# Patient Record
Sex: Male | Born: 1960 | Race: White | Marital: Married | Smoking: Never smoker
Health system: Southern US, Community
[De-identification: ages and names within clinical notes are randomized; demographics above are authoritative.]

## PROBLEM LIST (undated history)

## (undated) DIAGNOSIS — C4491 Basal cell carcinoma of skin, unspecified: Secondary | ICD-10-CM

## (undated) DIAGNOSIS — E785 Hyperlipidemia, unspecified: Secondary | ICD-10-CM

## (undated) DIAGNOSIS — I10 Essential (primary) hypertension: Secondary | ICD-10-CM

## (undated) HISTORY — DX: Essential (primary) hypertension: I10

## (undated) HISTORY — PX: SUPRAVENTRICULAR TACHYCARDIA ABLATION: SHX6106

## (undated) HISTORY — PX: TONSILLECTOMY: SUR1361

## (undated) HISTORY — PX: KNEE SURGERY: SHX244

## (undated) HISTORY — PX: MENISCUS REPAIR: SHX5179

## (undated) HISTORY — DX: Basal cell carcinoma of skin, unspecified: C44.91

## (undated) HISTORY — DX: Hyperlipidemia, unspecified: E78.5

## (undated) HISTORY — PX: HERNIA REPAIR: SHX51

---

## 2005-02-18 ENCOUNTER — Ambulatory Visit: Payer: Self-pay | Admitting: Internal Medicine

## 2005-02-22 ENCOUNTER — Ambulatory Visit (HOSPITAL_COMMUNITY): Admission: RE | Admit: 2005-02-22 | Discharge: 2005-02-23 | Payer: Self-pay | Admitting: Internal Medicine

## 2007-02-21 ENCOUNTER — Ambulatory Visit (HOSPITAL_COMMUNITY): Admission: RE | Admit: 2007-02-21 | Discharge: 2007-02-21 | Payer: Self-pay | Admitting: Orthopedic Surgery

## 2007-03-13 ENCOUNTER — Ambulatory Visit: Payer: Self-pay | Admitting: Cardiovascular Disease

## 2007-03-13 LAB — CONVERTED CEMR LAB
Basophils Relative: 0.7 % (ref 0.0–1.0)
CO2: 30 meq/L (ref 19–32)
Chloride: 102 meq/L (ref 96–112)
Creatinine, Ser: 0.9 mg/dL (ref 0.4–1.5)
Eosinophils Relative: 2.4 % (ref 0.0–5.0)
HCT: 41.2 % (ref 39.0–52.0)
Hemoglobin: 14.1 g/dL (ref 13.0–17.0)
INR: 0.8 — ABNORMAL LOW (ref 0.9–2.0)
Monocytes Absolute: 0.6 10*3/uL (ref 0.2–0.7)
Neutrophils Relative %: 51.2 % (ref 43.0–77.0)
Potassium: 4.1 meq/L (ref 3.5–5.1)
Prothrombin Time: 10.9 s (ref 10.0–14.0)
RDW: 11.8 % (ref 11.5–14.6)
Sodium: 139 meq/L (ref 135–145)
WBC: 5.1 10*3/uL (ref 4.5–10.5)

## 2007-03-20 ENCOUNTER — Ambulatory Visit (HOSPITAL_COMMUNITY): Admission: RE | Admit: 2007-03-20 | Discharge: 2007-03-20 | Payer: Self-pay | Admitting: Orthopedic Surgery

## 2007-12-03 ENCOUNTER — Ambulatory Visit: Payer: Self-pay | Admitting: Cardiovascular Disease

## 2007-12-03 LAB — CONVERTED CEMR LAB
ALT: 22 units/L (ref 0–53)
AST: 23 units/L (ref 0–37)
Bilirubin, Direct: 0.1 mg/dL (ref 0.0–0.3)
Total Bilirubin: 1.1 mg/dL (ref 0.3–1.2)

## 2008-08-23 ENCOUNTER — Ambulatory Visit: Payer: Self-pay | Admitting: Cardiovascular Disease

## 2008-08-23 LAB — CONVERTED CEMR LAB
ALT: 26 units/L (ref 0–53)
AST: 25 units/L (ref 0–37)
Albumin: 4.5 g/dL (ref 3.5–5.2)
BUN: 21 mg/dL (ref 6–23)
Basophils Absolute: 0 10*3/uL (ref 0.0–0.1)
Basophils Relative: 0.4 % (ref 0.0–3.0)
CO2: 29 meq/L (ref 19–32)
Calcium: 9.1 mg/dL (ref 8.4–10.5)
Chloride: 103 meq/L (ref 96–112)
Cholesterol: 148 mg/dL (ref 0–200)
Creatinine, Ser: 0.9 mg/dL (ref 0.4–1.5)
Eosinophils Relative: 2 % (ref 0.0–5.0)
Hemoglobin: 14.5 g/dL (ref 13.0–17.0)
LDL Cholesterol: 93 mg/dL (ref 0–99)
Lymphocytes Relative: 33.3 % (ref 12.0–46.0)
MCHC: 33.6 g/dL (ref 30.0–36.0)
MCV: 93.3 fL (ref 78.0–100.0)
Neutro Abs: 2.5 10*3/uL (ref 1.4–7.7)
Neutrophils Relative %: 53.7 % (ref 43.0–77.0)
RBC: 4.61 M/uL (ref 4.22–5.81)
Total Bilirubin: 0.8 mg/dL (ref 0.3–1.2)
Total Protein: 7.2 g/dL (ref 6.0–8.3)
WBC: 4.7 10*3/uL (ref 4.5–10.5)

## 2009-06-28 ENCOUNTER — Encounter (INDEPENDENT_AMBULATORY_CARE_PROVIDER_SITE_OTHER): Payer: Self-pay | Admitting: *Deleted

## 2010-12-25 ENCOUNTER — Other Ambulatory Visit (INDEPENDENT_AMBULATORY_CARE_PROVIDER_SITE_OTHER): Payer: 59

## 2010-12-25 ENCOUNTER — Other Ambulatory Visit: Payer: Self-pay | Admitting: Cardiology

## 2010-12-25 ENCOUNTER — Encounter (INDEPENDENT_AMBULATORY_CARE_PROVIDER_SITE_OTHER): Payer: Self-pay | Admitting: *Deleted

## 2010-12-25 ENCOUNTER — Encounter: Payer: Self-pay | Admitting: Cardiology

## 2010-12-25 DIAGNOSIS — I10 Essential (primary) hypertension: Secondary | ICD-10-CM | POA: Insufficient documentation

## 2010-12-25 LAB — BASIC METABOLIC PANEL
Calcium: 9 mg/dL (ref 8.4–10.5)
GFR: 82.05 mL/min (ref >60.00)
Potassium: 4.2 mEq/L (ref 3.5–5.1)
Sodium: 138 mEq/L (ref 135–145)

## 2010-12-28 ENCOUNTER — Other Ambulatory Visit: Payer: Self-pay | Admitting: Otolaryngology

## 2010-12-28 ENCOUNTER — Ambulatory Visit (HOSPITAL_BASED_OUTPATIENT_CLINIC_OR_DEPARTMENT_OTHER)
Admission: RE | Admit: 2010-12-28 | Discharge: 2010-12-28 | Disposition: A | Discharge status: Home / Self Care | Payer: 59 | Source: Ambulatory Visit | Attending: Otolaryngology | Admitting: Otolaryngology

## 2010-12-28 DIAGNOSIS — I10 Essential (primary) hypertension: Secondary | ICD-10-CM | POA: Insufficient documentation

## 2010-12-28 DIAGNOSIS — Z01812 Encounter for preprocedural laboratory examination: Secondary | ICD-10-CM | POA: Insufficient documentation

## 2010-12-28 DIAGNOSIS — J3501 Chronic tonsillitis: Secondary | ICD-10-CM | POA: Insufficient documentation

## 2010-12-28 LAB — POCT HEMOGLOBIN-HEMACUE: Hemoglobin: 15 g/dL (ref 13.0–17.0)

## 2011-01-03 NOTE — Miscellaneous (Signed)
Summary: Orders Update  Clinical Lists Changes  Problems: Added new problem of ESSENTIAL HYPERTENSION, BENIGN (ICD-401.1) Orders: Added new Test order of TLB-BMP (Basic Metabolic Panel-BMET) (80048-METABOL) - Signed

## 2011-02-25 NOTE — Op Note (Signed)
  NAMEADAMS, William [name]              ACCOUNT NO.:  [id number]  MEDICAL RECORD NO.:  [id number]           PATIENT TYPE:  LOCATION:                                 FACILITY:  PHYSICIAN:  Kinnie Scales. Annalee Genta, M.D.    DATE OF BIRTH:  DATE OF PROCEDURE:  12/28/2010 DATE OF DISCHARGE:                              OPERATIVE REPORT   PREOPERATIVE DIAGNOSIS:  Chronic tonsillitis.  POSTOPERATIVE DIAGNOSIS:  Chronic tonsillitis.  INDICATION FOR SURGERY:  Chronic tonsillitis.  SURGICAL PROCEDURE:  Tonsillectomy  ANESTHESIA:  General endotracheal.  SURGEON:  Carmen Vallecillo L. Annalee Genta, MD  COMPLICATIONS:  None.  BLOOD LOSS:  Minimal.  The patient was transferred from the operating room to the recovery room in stable condition.  BRIEF HISTORY:  The patient is a 50 year old white male with a history of chronic cryptic tonsillitis, chronic tonsil discharge and low-grade sore throat.  Given the patient's history and physical examination, I recommended to undertake tonsillectomy.  The risks, benefits, and possible complications of the procedure were discussed in detail with the patient and his wife and they understood and concurred with our plan for surgery which is scheduled as an outpatient under general anesthesia on December 28, 2010.  PROCEDURE:  The patient was brought to the operating room at [facility] and placed in the supine position on the operating table.  General endotracheal anesthesia was established without difficulty.  When the patient was adequately anesthetized, he was positioned on the operating table and prepped and draped in a sterile fashion.  Crowe-Davis mouthgag was inserted without difficulty. There no loose or broken teeth and the hard and soft palate were intact. The procedure was begun on the left-hand side using Bovie electrocautery and dissecting in subcapsular fashion.  The entire left tonsil was removed from superior pole to tongue base.   Right tonsil was removed in a similar fashion and the tonsil tissue sent to pathology for gross microscopic evaluation.  The tonsillar fossae were gently abraded with a dry tonsil sponge and several small areas of point hemorrhage were then cauterized with suction cautery.  The mouthgag was released and reapplied.  There was no active bleeding.  An orogastric tube was passed.  Stomach contents were aspirated.  The oral cavity and oropharynx were then irrigated and suctioned.  Crowe-Davis mouthgag was released and removed.  No loose or broken teeth.  Hard and soft palate intact.  No bleeding.  The patient was then awakened from his anesthetic, extubated and was transferred from the operating room to the recovery room in stable condition.  There were no complications and blood loss was minimal.    ______________________________ Kinnie Scales. Annalee Genta, M.D.   ______________________________ Kinnie Scales. Annalee Genta, M.D.    DLS/MEDQ  D:  04/54/0981  T:  12/28/2010  Job:  [id number]  Electronically Signed by Osborn Coho M.D. on 02/25/2011 10:17:00 AM

## 2011-03-12 NOTE — Op Note (Signed)
NAMEZOHAR, MARONEY              ACCOUNT NO.:  [id number]   MEDICAL RECORD NO.:  [id number]          PATIENT TYPE:  AMB   LOCATION:  DAY                          FACILITY:  [facility]   PHYSICIAN:  Ollen Gross, M.D.    DATE OF BIRTH:  Dec 16, 1960   DATE OF PROCEDURE:  03/20/2007  DATE OF DISCHARGE:                               OPERATIVE REPORT   PREOPERATIVE DIAGNOSIS:  Left knee lateral meniscal tear.   POSTOPERATIVE DIAGNOSIS:  Left knee lateral meniscal tear.   PROCEDURE:  Left knee arthroscopy with lateral meniscal debridement.   SURGEON:  Ollen Gross, M.D.   ASSISTANT:  None.   ANESTHESIA:  Local with MAC.   ESTIMATED BLOOD LOSS:  Minimal.   DRAINS:  None.   COMPLICATIONS:  None.   CONDITION:  Stable to recovery.   BRIEF CLINICAL NOTE:  Achille is a 50 year old who has had a long history  of problems and activity related with the left knee.  If he is doing any  activity involving jumping or cutting; he will get an effusion and have  lateral pain.  Regular activities are not bothering him.  Recent MRI  shows a lateral meniscal tear.  He presents now for arthroscopy and  debridement.   PROCEDURE IN DETAIL:  After the successful administration of knee block,  his left knee is then prepped and draped in the usual sterile fashion.  A total of 20 mL of 1% Xylocaine is then used to infiltrate the  superomedial, inferomedial, and inferolateral portals.  Superomedial and  inferolateral incisions were made and inflow cannula passed superomedial  and camera passed inferolateral.  Arthroscopic visualization proceeds.  Undersurface of patella and the trochlea looked normal.  He had some  slight synovitis in the suprapatellar area.  Medial and lateral gutters  are visualized; and there were no loose bodies.   Flexion and valgus force applied to the knee; and the medial compartment  was entered.  The medial compartment looks completely normal.  No  evidence of meniscal tear;  and no evidence of chondromalacia or chondral  defect medially.  The spinal needle was used to localize the  inferomedial portal.  A small incision was made, dilator placed, and a  probe placed.  The medial meniscus probes normally.  I flexed and knee  to see the posterior aspect of the condyle; and there was no evidence of  any cartilage damage.   The intercondylar notch was visualized.  There was a significant amount  of hypertrophic synovial tissue anterior to the ACL.  I used the  ArthroCare device to the debride this.  The ACL was extremely attenuated  and thin.  With a probe, it is very lax.  It seems like some of the  fibers are intact, but overall it is not functioning normal, as it is  attenuated.  The lateral compartment is entered.  He has a significant  tear at the posterior horn and body of the lateral meniscus.  The  meniscus was debrided back to a stable base with baskets and a 4.2 mm  shaver.  A posterior part was  thoroughly probed.  I could see the  popliteus tendon and it appears that some of the posterior meniscus is  adhered to the tendon.  This was all probed and stable.  I did not want  to destabilize the popliteus, thus I did not debride it back further.  The ArthroCare device was used to seal off the posterior aspect as well  as the debrided aspect of the body of the lateral meniscus.  It was  probed, again, and found was to be very stable.  I did not see evidence  of any chondral defect on the lateral femoral condyle or lateral tibial  plateau.  There was very mild chondromalacia.   We, again, inspected the rest of the joint; and no other defects or  cartilage tear was noted.  The arthroscopic equipment was then removed  from the inferior portals which were closed with interrupted 4-0 nylon;  20 mL of 1/4% Marcaine with epinephrine were injected through the inflow  cannula; and that was removed and that portal closed with Nylon.  A  bulky sterile dressing is  then applied; and he was awakened and  transported to recovery in stable condition.      Ollen Gross, M.D.  Electronically Signed     FA/MEDQ  D:  03/20/2007  T:  03/20/2007  Job:  [id number]

## 2011-03-15 NOTE — Discharge Summary (Signed)
William [name], William [name]              ACCOUNT NO.:  [id number]   MEDICAL RECORD NO.:  [id number]          PATIENT TYPE:  OIB   LOCATION:  6531                         FACILITY:  [facility]   PHYSICIAN:  Doylene Canning. Ladona Ridgel, M.D.  DATE OF BIRTH:  1961/03/29   DATE OF ADMISSION:  02/22/2005  DATE OF DISCHARGE:                                 DISCHARGE SUMMARY   ADDENDUM:   DISCHARGE MEDICATIONS:  Coated aspirin 1 daily.      GS/MEDQ  D:  02/23/2005  T:  02/23/2005  Job:  [id number]

## 2011-03-15 NOTE — Op Note (Signed)
NAMEJODECI, William [name]              ACCOUNT NO.:  [id number]   MEDICAL RECORD NO.:  [id number]          PATIENT TYPE:  OIB   LOCATION:  NA                           FACILITY:  [facility]   PHYSICIAN:  Doylene Canning. Ladona Ridgel, M.D.  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   INTRODUCTION:  The patient is a 50 year old male with a history of  hypertension. He has had a 5-6 year history of recurrent tachy palpitations  and was found to have multiple episodes of SVT despite medical therapy.  Interestingly enough, during tachycardia, left bundle branch block slows the  tachycardia, narrow QRS tachycardia increases and he transitions from one to  the other on a regular basis. 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 He is referred for  catheter ablation.   PROCEDURE PERFORMED:  After informed consent was obtained, the patient was  taken to the diagnostic EP lab in the fasting state. The usual preparation,  draping, intravenous fentanyl and midazolam was given for sedation. A 6-  French flexible catheter was inserted percutaneously into the right jugular  vein and advanced to the coronary sinus. A 5-French quadripolar catheter was  inserted percutaneously into the right femoral vein and advanced to the RV  apex. A 5-French quadripolar catheter was inserted percutaneously in the  right femoral vein and advanced to the His bundle region. During catheter  manipulation, the patient had incessant SVT. Mapping demonstrated earliest  atrial activation at approximately 5 o'clock on the mitral valve annulus.  This suggested a left posterior or left posterolateral accessory pathway.  The tachycardia  could be terminated with rapid ventricular pacing, but would  frequently reintroduce itself typically with a PAC. Ventricular pacing was  then carried out demonstrating eccentric atrial activation. The rapid  ventricular pacing was incremented down and at 400 milliseconds, resulting  in reinitiation of SVT. Following this, programmed ventricular stimulation  was carried out from the RV apex at a base drive cycle length of 161  milliseconds. The S1 and S2 interval was stepwise decreased down to 310  milliseconds where the retrograde accessory pathway ERP was observed. Next,  rapid atrial pacing was carried out from the coronary sinus as well as  atrium, resulting in incessant induction of SVT. It should be noted that  there was no evidence of any antegrade accessory pathway conduction when  pacing from the coronary sinus right next to the accessory pathway. With all  of the above in hand, the right femoral artery was punctured and a 7-French  quadripolar grip ablation catheter was advanced retrograde across the aortic  valve into the left ventricle. The ablation catheter was maneuvered onto the  mitral annulus and mapping was carried out. Two energy applications were  subsequently delivered during rapid ventricular pacing at 600 milliseconds.  This resulted in the [place] dissociation. Following  this, rapid ventricular  pacing was carried out with no evidence of accessory pathway conduction.  At  this point, rapid atrial pacing was carried out from the coronary sinus, as  well as the right atrium, with paced cycle length of 500 milliseconds and  stepwise decreased down to 320 milliseconds where AV Wenckebach was  observed. During rapid atrial pacing, the PR interval was equal to the R  interval and there was no inducible SVT following catheter ablation. Next,  programmed atrial stimulation was carried out from the coronary sinus as  well as the  right atrium based on a cycle length of 500  milliseconds. The  S1 and S2 interval was stepwise decreased from 440 milliseconds down to 300  milliseconds where the AV node ERP was observed. During programmed atrial  stimulation, there no AH jumps nor any echo beats, and no inducible SVT. At  this point, after an hour of observation and no inducible tachycardia, no  evidence of any left posterolateral accessory pathway conduction, the  catheter was removed. Hemostasis was assured and the patient was returned to  his room in satisfactory condition. Of note, the patient was given heparin  5000 units during the procedure prior to placement of the ablation catheter  into the left ventricle. Following the ablation procedure, he was observed  for 1 hour and had no evidence of accessory pathway conduction. During that  time, his ACT was monitored and was less than 200 following the ablation.   COMPLICATIONS:  There were no immediate procedure complications. It should  be noted that there was transient catheter induced left bundle branch block  persisting for approximately 40 minutes, which resolved spontaneously. This  left bundle branch block occurred prior to any RF ablation therapy  delivered.   RESULTS:  A. Baseline ECG: Baseline ECG demonstrates normal sinus rhythm  with normal axis and intervals.  B. Baseline intervals: Cycle length was 940 milliseconds. The HV interval  was 65 milliseconds. The AH interval 100 milliseconds.  C. Rapid ventricular pacing: Following catheter ablation, rapid ventricular  pacing demonstrated VA dissociation. Prior to catheter ablation, rapid  ventricular pacing demonstrated eccentric atrial activation and incessant  induction of SVT. 4. D. Programmed ventricular stimulation: Programmed  ventricular stimulation was carried out from the RV apex at base drive cycle  lengths of 045 milliseconds. The S1 and S2 interval was stepwise decreased from 440 milliseconds down to 300 milliseconds where the retrograde  pathway  ERP was observed. During programmed ventricular stimulation prior to  ablation, the atrial activation was nondetrimental and eccentric. It should  be noted following catheter ablation, there is VA dissociation.  E. Rapid atrial pacing: Rapid atrial pacing was carried out from the  coronary sinus prior to catheter ablation but at any pacing cycle length,  resulted in induction of SVT. Following catheter ablation, rapid atrial  pacing was carried out from the coronary sinus as well as the right atrium  and stepwise decreased down to 320 milliseconds where AV Wenckebach was  observed. During rapid atrial pacing, the PR interval was equal to but not  greater than the R interval and there was no inducible SVT following  ablation.  F. Programmed atrial stimulation: Programmed atrial stimulation was carried  out from the coronary sinus as well as the right atrium at base drive cycle  length of 409 milliseconds. The S1 and S2 interval was stepwise decreased  from 440 milliseconds down to 300 milliseconds where the AV node ERP was  observed.  During programmed atrial stimulation,  there were no H jumps or  echo beats. The PR interval was equal to the R interval. There was no  inducible SVT following programmed atrial stimulation, following catheter  ablation.  G. Arrhythmias observed:  1.  AV reentrant tachycardia initiation spontaneous as well as with rapid      atrial pacing duration was sustained. Termination was spontaneous as      well as with rapid ventricular pacing, the cycle length was 354      milliseconds.      1.  Mapping: Mapping of the patient's accessory pathway demonstrated          earliest atrial activation approximately between 4 and 5 o'clock on          the mitral annulus.          1.  RF energy application: A total of 2 Hz energy applications were              delivered to the atrial insertion of the accessory pathway              resulting in termination of  accessory pathway conduction,              creation of the VA dissociation, and rendering the tachycardia              noninducible.   CONCLUSION:  The study demonstrates successful electrophysiologic study and  RF catheter ablation of a concealed left posterolateral accessory pathway  with a total of 2 Hz energy applications delivered to the atrial insertion  of the accessory pathway. Prior RF energy application, there was catheter  induced left bundle branch block which resolved spontaneously. Following  catheter ablation, there was no inducible arrhythmias present.      GWT/MEDQ  D:  02/22/2005  T:  02/22/2005  Job:  [id number]

## 2011-03-15 NOTE — Discharge Summary (Signed)
NAMEIDEN, STRIPLING              ACCOUNT NO.:  [id number]   MEDICAL RECORD NO.:  [id number]          PATIENT TYPE:  OIB   LOCATION:  6531                         FACILITY:  [facility]   PHYSICIAN:  Doylene Canning. Ladona Ridgel, M.D.  DATE OF BIRTH:  1961/06/20   DATE OF ADMISSION:  02/22/2005  DATE OF DISCHARGE:  02/23/2005                                 DISCHARGE SUMMARY   PROCEDURE:  Medial frequency catheter ablation February 14, 2005.   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 Sharrell Ku.  Please refer to  admission note for full details.   HOSPITAL COURSE:  The patient presented for elective RF ablation performed  successfully by Dr. Ladona Ridgel on February 22, 2005, with successful ablation of  inducible AVNRT.  There were no noted complications, the patient was kept  overnight observation.   The patient was cleared for discharge the following morning, by Dr. Sharrell Ku, who noted question of minimal hematoma of the right groin.  He  mentioned that there was some initial difficulty with rebleeding.  The  patient was deemed stable for discharge.  Dr. Ladona Ridgel did, however, note  development of transient catheter-induced left bundle branch block, which  resolved one hour later.   Postoperatively, the patient was noted to have development of a minimal  hematoma secondary to initial difficulty with rebleeding.  The patient was  monitored closely by Dr. Ladona Ridgel postprocedure.  On examination the following  morning, Dr. Ladona Ridgel noted presence of a small hematoma, but absent of an  associated bruit.   The patient was cleared for discharge, with recommendation to resume  previous home medication regimen.   DISCHARGE MEDICATIONS:  1.  Toprol XL 50 mg daily.  2.  Hydrochlorothiazide 25 mg daily.  3.  Accupril 40 mg daily.   INSTRUCTIONS:  No heavy lifting for seven  days.   The patient may use either Tylenol or Motrin for pain, as needed.   Arrangements will be made for the patient will follow-up with Dr. Sharrell Ku in ensuing few weeks.   DISCHARGE DIAGNOSES:  1.  Recurrent tachy palpitations/documented supraventricular tachycardia.      1.  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      2.  Transient catheter-induced left bundle branch block.      3.  Small right groin hematoma.  2.  Hypertension.  3.  Remote tobacco.      GS/MEDQ  D:  02/23/2005  T:  02/23/2005  Job:  [id number]

## 2011-07-30 ENCOUNTER — Other Ambulatory Visit: Payer: Self-pay | Admitting: *Deleted

## 2011-07-30 MED ORDER — LISINOPRIL 40 MG PO TABS: 40.0000 mg | ORAL_TABLET | Freq: Every day | ORAL | Status: DC

## 2011-09-20 ENCOUNTER — Other Ambulatory Visit: Payer: Self-pay | Admitting: *Deleted

## 2011-09-20 MED ORDER — FLUTICASONE PROPIONATE 50 MCG/ACT NA SUSP: 2.0000 | Freq: Every day | NASAL | Status: DC

## 2012-10-26 ENCOUNTER — Other Ambulatory Visit: Payer: Self-pay | Admitting: *Deleted

## 2012-10-26 MED ORDER — LISINOPRIL 40 MG PO TABS: 40.0000 mg | ORAL_TABLET | Freq: Every day | ORAL | Status: DC

## 2012-10-28 HISTORY — PX: COLONOSCOPY: SHX174

## 2013-10-01 ENCOUNTER — Ambulatory Visit (AMBULATORY_SURGERY_CENTER): Payer: Self-pay

## 2013-10-01 VITALS — Ht 72.0 in | Wt 195.0 lb

## 2013-10-01 DIAGNOSIS — Z1211 Encounter for screening for malignant neoplasm of colon: Secondary | ICD-10-CM

## 2013-10-01 MED ORDER — NA SULFATE-K SULFATE-MG SULF 17.5-3.13-1.6 GM/177ML PO SOLN: 1.0000 | Freq: Once | ORAL | Status: DC

## 2013-10-15 ENCOUNTER — Encounter: Payer: Self-pay | Admitting: Internal Medicine

## 2013-10-15 ENCOUNTER — Ambulatory Visit (AMBULATORY_SURGERY_CENTER): Payer: 59 | Admitting: Internal Medicine

## 2013-10-15 VITALS — BP 110/76 | HR 62 | Temp 98.2°F | Resp 12 | Ht 72.0 in | Wt 195.0 lb

## 2013-10-15 DIAGNOSIS — D126 Benign neoplasm of colon, unspecified: Secondary | ICD-10-CM

## 2013-10-15 DIAGNOSIS — Z1211 Encounter for screening for malignant neoplasm of colon: Secondary | ICD-10-CM

## 2013-10-15 DIAGNOSIS — K5730 Diverticulosis of large intestine without perforation or abscess without bleeding: Secondary | ICD-10-CM

## 2013-10-15 MED ORDER — SODIUM CHLORIDE 0.9 % IV SOLN: 500.0000 mL | INTRAVENOUS | Status: DC

## 2013-10-15 NOTE — Patient Instructions (Addendum)
I found and removed one polyp it was 1-2 mm. You also have mild sigmoid diverticulosis.  I will let you know the results and recommendations - next routine colonoscopy at least 5 years away.  I appreciate the opportunity to care for you. Iva Boop, MD, [facility]  Discharge instructions given with verbal understanding. Handouts on polyps and diverticulosis. Resume previous medications. YOU HAD AN ENDOSCOPIC PROCEDURE TODAY AT THE [facility]: Refer to the procedure report that was given to you for any specific questions about what was found during the examination.  If the procedure report does not answer your questions, please call your gastroenterologist to clarify.  If you requested that your care partner not be given the details of your procedure findings, then the procedure report has been included in a sealed envelope for you to review at your convenience later.  YOU SHOULD EXPECT: Some feelings of bloating in the abdomen. Passage of more gas than usual.  Walking can help get rid of the air that was put into your GI tract during the procedure and reduce the bloating. If you had a lower endoscopy (such as a colonoscopy or flexible sigmoidoscopy) you may notice spotting of blood in your stool or on the toilet paper. If you underwent a bowel prep for your procedure, then you may not have a normal bowel movement for a few days.  DIET: Your first meal following the procedure should be a light meal and then it is ok to progress to your normal diet.  A half-sandwich or bowl of soup is an example of a good first meal.  Heavy or fried foods are harder to digest and may make you feel nauseous or bloated.  Likewise meals heavy in dairy and vegetables can cause extra gas to form and this can also increase the bloating.  Drink plenty of fluids but you should avoid alcoholic beverages for 24 hours.  ACTIVITY: Your care partner should take you home directly after the procedure.  You should plan to  take it easy, moving slowly for the rest of the day.  You can resume normal activity the day after the procedure however you should NOT DRIVE or use heavy machinery for 24 hours (because of the sedation medicines used during the test).    SYMPTOMS TO REPORT IMMEDIATELY: A gastroenterologist can be reached at any hour.  During normal business hours, 8:30 AM to 5:00 PM Monday through Friday, call [phone].  After hours and on weekends, please call the GI answering service at [phone] who will take a message and have the physician on call contact you.   Following lower endoscopy (colonoscopy or flexible sigmoidoscopy):  Excessive amounts of blood in the stool  Significant tenderness or worsening of abdominal pains  Swelling of the abdomen that is new, acute  Fever of 100F or higher  FOLLOW UP: If any biopsies were taken you will be contacted by phone or by letter within the next 1-3 weeks.  Call your gastroenterologist if you have not heard about the biopsies in 3 weeks.  Our staff will call the home number listed on your records the next business day following your procedure to check on you and address any questions or concerns that you may have at that time regarding the information given to you following your procedure. This is a courtesy call and so if there is no answer at the home number and we have not heard from you through the emergency physician on call,  we will assume that you have returned to your regular daily activities without incident.  SIGNATURES/CONFIDENTIALITY: You and/or your care partner have signed paperwork which will be entered into your electronic medical record.  These signatures attest to the fact that that the information above on your After Visit Summary has been reviewed and is understood.  Full responsibility of the confidentiality of this discharge information lies with you and/or your care-partner.

## 2013-10-15 NOTE — Progress Notes (Signed)
Called to room to assist during endoscopic procedure.  Patient ID and intended procedure confirmed with present staff. Received instructions for my participation in the procedure from the performing physician.  

## 2013-10-15 NOTE — Progress Notes (Signed)
Procedure ends, to recovery, report given and VSS. 

## 2013-10-15 NOTE — Op Note (Signed)
[facility] [address] [place], [address]   COLONOSCOPY PROCEDURE REPORT  PATIENT: William [name], William [name]  MR#: [id number] BIRTHDATE: [birth year] , 52  yrs. old GENDER: Male ENDOSCOPIST: Iva Boop, MD, [facility] PROCEDURE DATE:  10/15/2013 PROCEDURE:   Colonoscopy with biopsy First Screening Colonoscopy - Avg.  risk and is 50 yrs.  old or older Yes.  Prior Negative Screening - Now for repeat screening. N/A  History of Adenoma - Now for follow-up colonoscopy & has been > or = to 3 yrs.  N/A  Polyps Removed Today? Yes. ASA CLASS:   Class II INDICATIONS:average risk screening and first colonoscopy. MEDICATIONS: propofol (Diprivan) 300mg  IV, MAC sedation, administered by CRNA, and These medications were titrated to patient response per physician's verbal order  DESCRIPTION OF PROCEDURE:   After the risks benefits and alternatives of the procedure were thoroughly explained, informed consent was obtained.  A digital rectal exam revealed no abnormalities of the rectum, A digital rectal exam revealed the prostate was not enlarged, and A digital rectal exam revealed no prostatic nodules.   The LB [id number]  endoscope was introduced through the anus and advanced to the cecum, which was identified by both the appendix and ileocecal valve. No adverse events experienced.   The quality of the prep was excellent using Suprep  The instrument was then slowly withdrawn as the colon was fully examined.   COLON FINDINGS: A sessile polyp measuring 1-2 mm in size was found at the splenic flexure.  A polypectomy was performed with cold forceps.  The resection was complete and the polyp tissue was completely retrieved.   There was mild scattered diverticulosis noted in the sigmoid colon.   The colon mucosa was otherwise normal.   A right colon retroflexion was performed.  Retroflexed views revealed no abnormalities. The time to cecum=5 minutes 45 seconds.  Withdrawal time=9  minutes 48 seconds.  The scope was withdrawn and the procedure completed. COMPLICATIONS: There were no complications.  ENDOSCOPIC IMPRESSION: 1.   Sessile polyp measuring 1-2 mm in size was found at the splenic flexure; polypectomy was performed with cold forceps 2.   There was mild diverticulosis noted in the sigmoid colon 3.   The colon mucosa was otherwise normal - excellent prep - first colonoscopy 4.   Normal prostate  RECOMMENDATIONS: Timing of repeat colonoscopy will be determined by pathology findings.   eSigned:  Iva Boop, MD, [facility] 10/15/2013 8:38 AM  cc: The Patient

## 2013-10-15 NOTE — Progress Notes (Signed)
Patient did not experience any of the following events: a burn prior to discharge; a fall within the facility; wrong site/side/patient/procedure/implant event; or a hospital transfer or hospital admission upon discharge from the facility. (G8907) Patient did not have preoperative order for IV antibiotic SSI prophylaxis. (G8918)  

## 2013-10-18 ENCOUNTER — Telehealth: Payer: Self-pay

## 2013-10-18 NOTE — Telephone Encounter (Signed)
  Follow up Call-  Call back number 10/15/2013  Post procedure Call Back phone  # [phone]  Permission to leave phone message Yes     Patient questions:  Do you have a fever, pain , or abdominal swelling? no Pain Score  0 *  Have you tolerated food without any problems? yes  Have you been able to return to your normal activities? yes  Do you have any questions about your discharge instructions: Diet   no Medications  no Follow up visit  no  Do you have questions or concerns about your Care? no  Actions: * If pain score is 4 or above: No action needed, pain <4.   No problems per the pt. Maw

## 2013-10-24 ENCOUNTER — Encounter: Payer: Self-pay | Admitting: Internal Medicine

## 2013-10-24 NOTE — Progress Notes (Signed)
Quick Note:  Benign lymphoid polyp - repeat colon 2024 ______

## 2013-11-16 ENCOUNTER — Other Ambulatory Visit: Payer: Self-pay | Admitting: *Deleted

## 2013-11-16 MED ORDER — LISINOPRIL 40 MG PO TABS: 40.0000 mg | ORAL_TABLET | Freq: Every day | ORAL | Status: DC

## 2014-11-24 ENCOUNTER — Other Ambulatory Visit: Payer: Self-pay | Admitting: *Deleted

## 2014-11-24 MED ORDER — LISINOPRIL 40 MG PO TABS: 40.0000 mg | ORAL_TABLET | Freq: Every day | ORAL | Status: DC

## 2015-04-07 ENCOUNTER — Other Ambulatory Visit: Payer: Self-pay | Admitting: *Deleted

## 2015-04-07 MED ORDER — LOTEPREDNOL ETABONATE 0.5 % OP SUSP: 1.0000 [drp] | Freq: Four times a day (QID) | OPHTHALMIC | Status: DC

## 2015-08-07 ENCOUNTER — Other Ambulatory Visit: Payer: Self-pay | Admitting: *Deleted

## 2015-08-07 MED ORDER — ROSUVASTATIN CALCIUM 5 MG PO TABS
5.0000 mg | ORAL_TABLET | Freq: Every day | ORAL | Status: DC
Start: 2015-08-07 — End: 2018-11-30

## 2015-11-24 ENCOUNTER — Other Ambulatory Visit: Payer: Self-pay | Admitting: *Deleted

## 2015-11-24 MED ORDER — LISINOPRIL 40 MG PO TABS: 40.0000 mg | ORAL_TABLET | Freq: Every day | ORAL | Status: DC

## 2015-11-24 MED FILL — ROSUVASTATIN CALCIUM 5 MG T: 5 | 90 days supply | Qty: 90 | Fill #1

## 2015-11-24 MED FILL — LISINOPRIL 40 MG TABLET: 40 | 90 days supply | Qty: 90 | Fill #0

## 2016-02-21 MED FILL — ROSUVASTATIN CALCIUM 5 MG T: 5 | 90 days supply | Qty: 90 | Fill #2

## 2016-02-21 MED FILL — LISINOPRIL 40 MG TABLET: 40 | 90 days supply | Qty: 90 | Fill #1

## 2016-05-27 MED FILL — LISINOPRIL 40 MG TABLET: 40 | 90 days supply | Qty: 90 | Fill #2

## 2016-05-27 MED FILL — ROSUVASTATIN CALCIUM 5 MG T: 5 | 90 days supply | Qty: 90 | Fill #3

## 2016-08-01 MED FILL — CIALIS 20 MG TABLET: 20 | 30 days supply | Qty: 6 | Fill #0

## 2016-08-22 MED FILL — LISINOPRIL 40 MG TABLET: 40 | 90 days supply | Qty: 90 | Fill #3

## 2016-08-23 MED FILL — ROSUVASTATIN CALCIUM 10 MG: 10 | 90 days supply | Qty: 90 | Fill #0

## 2016-09-30 MED FILL — CIALIS 20 MG TABLET: 20 | 30 days supply | Qty: 6 | Fill #1

## 2016-11-04 DIAGNOSIS — D485 Neoplasm of uncertain behavior of skin: Secondary | ICD-10-CM | POA: Diagnosis not present

## 2016-11-04 DIAGNOSIS — D2272 Melanocytic nevi of left lower limb, including hip: Secondary | ICD-10-CM | POA: Diagnosis not present

## 2016-11-04 DIAGNOSIS — D225 Melanocytic nevi of trunk: Secondary | ICD-10-CM | POA: Diagnosis not present

## 2016-11-04 DIAGNOSIS — D2271 Melanocytic nevi of right lower limb, including hip: Secondary | ICD-10-CM | POA: Diagnosis not present

## 2016-11-04 DIAGNOSIS — D1801 Hemangioma of skin and subcutaneous tissue: Secondary | ICD-10-CM | POA: Diagnosis not present

## 2016-11-04 DIAGNOSIS — Z85828 Personal history of other malignant neoplasm of skin: Secondary | ICD-10-CM | POA: Diagnosis not present

## 2016-11-04 DIAGNOSIS — L821 Other seborrheic keratosis: Secondary | ICD-10-CM | POA: Diagnosis not present

## 2016-11-18 MED FILL — ROSUVASTATIN CALCIUM 10 MG: 10 | 90 days supply | Qty: 90 | Fill #1

## 2016-11-19 MED FILL — LISINOPRIL 40 MG TABLET: 40 | 90 days supply | Qty: 90 | Fill #0

## 2016-11-22 DIAGNOSIS — H524 Presbyopia: Secondary | ICD-10-CM | POA: Diagnosis not present

## 2017-02-18 MED FILL — LISINOPRIL 40 MG TABLET: 40 | 90 days supply | Qty: 90 | Fill #1

## 2017-02-18 MED FILL — ROSUVASTATIN CALCIUM 10 MG: 10 | 90 days supply | Qty: 90 | Fill #2

## 2017-05-26 MED FILL — LISINOPRIL 40 MG TAB: 40 | 90 days supply | Qty: 90 | Fill #2

## 2017-05-26 MED FILL — ROSUVASTATIN CALCIUM 10 MG: 10 | 90 days supply | Qty: 90 | Fill #3

## 2017-06-10 DIAGNOSIS — N528 Other male erectile dysfunction: Secondary | ICD-10-CM | POA: Diagnosis not present

## 2017-06-10 DIAGNOSIS — Z125 Encounter for screening for malignant neoplasm of prostate: Secondary | ICD-10-CM | POA: Diagnosis not present

## 2017-06-10 DIAGNOSIS — I10 Essential (primary) hypertension: Secondary | ICD-10-CM | POA: Diagnosis not present

## 2017-06-10 DIAGNOSIS — Z0000 Encounter for general adult medical examination without abnormal findings: Secondary | ICD-10-CM | POA: Diagnosis not present

## 2017-06-10 DIAGNOSIS — Z1389 Encounter for screening for other disorder: Secondary | ICD-10-CM | POA: Diagnosis not present

## 2017-06-10 DIAGNOSIS — E784 Other hyperlipidemia: Secondary | ICD-10-CM | POA: Diagnosis not present

## 2017-06-10 DIAGNOSIS — Z6826 Body mass index (BMI) 26.0-26.9, adult: Secondary | ICD-10-CM | POA: Diagnosis not present

## 2017-06-10 DIAGNOSIS — Z8249 Family history of ischemic heart disease and other diseases of the circulatory system: Secondary | ICD-10-CM | POA: Diagnosis not present

## 2017-08-15 MED FILL — LISINOPRIL 40 MG TABLET: 40 | 90 days supply | Qty: 90 | Fill #3

## 2017-08-15 MED FILL — ROSUVASTATIN CALCIUM 10 MG: 10 | 90 days supply | Qty: 90 | Fill #4

## 2017-11-20 MED FILL — ROSUVASTATIN CALCIUM 10 MG: 10 | 90 days supply | Qty: 90 | Fill #0

## 2017-11-20 MED FILL — LISINOPRIL 40 MG TABLET: 40 | 90 days supply | Qty: 90 | Fill #0

## 2018-02-18 MED FILL — LISINOPRIL 40 MG TABLET: 40 | 90 days supply | Qty: 90 | Fill #1

## 2018-02-18 MED FILL — ROSUVASTATIN CALCIUM 10 MG: 10 | 90 days supply | Qty: 90 | Fill #1

## 2018-05-13 DIAGNOSIS — H524 Presbyopia: Secondary | ICD-10-CM | POA: Diagnosis not present

## 2018-05-18 MED FILL — ROSUVASTATIN CALCIUM 10 MG: 10 | 90 days supply | Qty: 90 | Fill #2

## 2018-05-18 MED FILL — LISINOPRIL 40 MG TABLET: 40 | 90 days supply | Qty: 90 | Fill #2

## 2018-08-17 MED FILL — LISINOPRIL 40 MG TABLET: 40 | 90 days supply | Qty: 90 | Fill #0

## 2018-08-17 MED FILL — ROSUVASTATIN CALCIUM 10 MG: 10 | 90 days supply | Qty: 90 | Fill #0

## 2018-11-11 MED FILL — LISINOPRIL 40 MG TABLET: 40 | 90 days supply | Qty: 90 | Fill #1

## 2018-11-11 MED FILL — ROSUVASTATIN CALCIUM 10 MG: 10 | 90 days supply | Qty: 90 | Fill #1

## 2018-11-30 ENCOUNTER — Other Ambulatory Visit: Payer: Self-pay | Admitting: *Deleted

## 2018-11-30 MED ORDER — ROSUVASTATIN CALCIUM 40 MG PO TABS: 40.0000 mg | ORAL_TABLET | Freq: Every day | ORAL | 3 refills | Status: DC

## 2018-11-30 MED FILL — ROSUVASTATIN CALCIUM 40 MG: 40 | 90 days supply | Qty: 90 | Fill #0 | Status: TO

## 2019-01-27 MED FILL — LISINOPRIL 40 MG TABLET: 40 | 90 days supply | Qty: 90 | Fill #0

## 2019-02-10 MED FILL — ROSUVASTATIN CALCIUM 40 MG: 40 | 90 days supply | Qty: 90 | Fill #0

## 2019-03-05 DIAGNOSIS — Z8249 Family history of ischemic heart disease and other diseases of the circulatory system: Secondary | ICD-10-CM | POA: Diagnosis not present

## 2019-03-05 DIAGNOSIS — E785 Hyperlipidemia, unspecified: Secondary | ICD-10-CM | POA: Diagnosis not present

## 2019-03-05 DIAGNOSIS — I10 Essential (primary) hypertension: Secondary | ICD-10-CM | POA: Diagnosis not present

## 2019-03-05 DIAGNOSIS — N529 Male erectile dysfunction, unspecified: Secondary | ICD-10-CM | POA: Diagnosis not present

## 2019-03-05 DIAGNOSIS — Z0000 Encounter for general adult medical examination without abnormal findings: Secondary | ICD-10-CM | POA: Diagnosis not present

## 2019-03-08 DIAGNOSIS — E7849 Other hyperlipidemia: Secondary | ICD-10-CM | POA: Diagnosis not present

## 2019-03-08 DIAGNOSIS — Z125 Encounter for screening for malignant neoplasm of prostate: Secondary | ICD-10-CM | POA: Diagnosis not present

## 2019-03-08 DIAGNOSIS — Z0000 Encounter for general adult medical examination without abnormal findings: Secondary | ICD-10-CM | POA: Diagnosis not present

## 2019-03-19 DIAGNOSIS — R82998 Other abnormal findings in urine: Secondary | ICD-10-CM | POA: Diagnosis not present

## 2019-05-05 MED FILL — LISINOPRIL 40 MG TABLET: 40 | 90 days supply | Qty: 90 | Fill #0

## 2019-06-17 MED FILL — ROSUVASTATIN CALCIUM 40 MG: 40 | 90 days supply | Qty: 90 | Fill #0

## 2019-08-09 MED FILL — LISINOPRIL 40 MG TABLET: 40 | 90 days supply | Qty: 90 | Fill #1

## 2019-09-14 MED FILL — ROSUVASTATIN CALCIUM 40 MG: 40 | 90 days supply | Qty: 90 | Fill #1

## 2019-11-01 MED FILL — LISINOPRIL 40 MG TABLET: 40 | 90 days supply | Qty: 90 | Fill #2

## 2019-12-14 MED FILL — ROSUVASTATIN CALCIUM 40 MG: 40 | 90 days supply | Qty: 90 | Fill #0

## 2020-01-26 MED FILL — LISINOPRIL 40 MG TABLET: 40 | 90 days supply | Qty: 90 | Fill #0

## 2020-01-27 ENCOUNTER — Other Ambulatory Visit (HOSPITAL_COMMUNITY): Payer: Self-pay | Admitting: Internal Medicine

## 2020-05-10 MED FILL — LISINOPRIL 40 MG TABS: 40 | 30 days supply | Qty: 30 | Fill #0

## 2020-06-07 MED FILL — LISINOPRIL 40 MG TABS: 40 | 90 days supply | Qty: 90 | Fill #1

## 2020-06-12 MED FILL — ROSUVASTATIN CALCIUM 40 MG: 40 | 90 days supply | Qty: 90 | Fill #2

## 2020-07-12 ENCOUNTER — Encounter: Payer: Self-pay | Admitting: Orthopedic Surgery

## 2020-07-12 ENCOUNTER — Ambulatory Visit (INDEPENDENT_AMBULATORY_CARE_PROVIDER_SITE_OTHER): Payer: 59

## 2020-07-12 ENCOUNTER — Ambulatory Visit (INDEPENDENT_AMBULATORY_CARE_PROVIDER_SITE_OTHER): Payer: 59 | Admitting: Orthopedic Surgery

## 2020-07-12 DIAGNOSIS — M25512 Pain in left shoulder: Secondary | ICD-10-CM | POA: Diagnosis not present

## 2020-07-12 DIAGNOSIS — M7552 Bursitis of left shoulder: Secondary | ICD-10-CM | POA: Diagnosis not present

## 2020-07-12 NOTE — Progress Notes (Signed)
Office Visit Note   Patient: William [name]           Date of Birth: 01/21/1961           MRN: [id number] Visit Date: 07/12/2020 Requested by: Marton Redwood, MD [address] [place],  [place] [address] PCP: Marton Redwood, MD  Subjective: Chief Complaint  Patient presents with  . 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  Denies any discrete injury but he was painting a cabinet and was leaning on that arm for extended periods of time during that painting.  Denies any neck pain.  No radicular symptoms and denies any loss of range of motion.  Localizes this pain primarily to the anterolateral aspect of the shoulder in the bicipital groove region.  Takes meloxicam occasionally for his symptoms.  He describes that the primary motion which hurts the shoulder is abduction.  Forward flexion is well-tolerated.  Denies any mechanical symptoms.  He is right-hand dominant.  Difficult for Dewayne to sleep on that side.  Will occasionally wake him from sleep at night.  He is unable to do push-ups or physical fitness types of activities which involve his shoulder.              ROS: All systems reviewed are negative as they relate to the chief complaint within the history of present illness.  Patient denies  fevers or chills.   Assessment & Plan: Visit Diagnoses:  1. Left shoulder pain, unspecified chronicity   2. Presence of left artificial shoulder joint     Plan: Impression is left shoulder pain with no discrete AC joint tenderness to direct palpation left versus right.  Rotator cuff strength is pretty reasonable except for objective mild weakness to infraspinatus testing on the left compared to the right which may be pain related.  Labral testing unremarkable.  No coarse grinding or crepitus in the shoulder.  No restriction of passive range of motion.  I think Darroll may have biceps tendinitis versus SLAP tear versus rotator cuff  strain of the infraspinatus muscle tendon junction.  Rotator cuff tearing less likely based on history.  Due to persistence of symptoms longer than 2-1/2 months as well as failure of conservative management as well as a component of pain which wakes him from sleep at night as well as the objective finding of weakness on infraspinatus testing I recommend MRI arthrogram to evaluate for biceps tendinitis versus SLAP tear versus infraspinatus tear versus strain versus bursitis.  Follow-up after that study  Follow-Up Instructions: No follow-ups on file.   Orders:  Orders Placed This Encounter  Procedures  . XR Shoulder Left  . MR Shoulder Left w/ contrast  . Arthrogram   No orders of the defined types were placed in this encounter.     Procedures: No procedures performed   Clinical Data: No additional findings.  Objective: Vital Signs: There were no vitals taken for this visit.  Physical Exam:   Constitutional: Patient appears well-developed HEENT:  Head: Normocephalic Eyes:EOM are normal Neck: Normal range of motion Cardiovascular: Normal rate Pulmonary/chest: Effort normal Neurologic: Patient is alert Skin: Skin is warm Psychiatric: Patient has normal mood and affect    Ortho Exam: Ortho exam demonstrates good cervical spine range of motion.  5 out of 5 grip EPL FPL interosseous wrist flexion extension bicep triceps and deltoid strength.  No restriction of external rotation of 15 degrees of abduction.  Both go to about 45 degrees.  Does have pain with resisted infraspinatus testing on the left compared to the right and about 5- out of 5 strength left versus right on infraspinatus testing.  Supraspinatus subscap testing 5+ out of 5 bilaterally.  O'Brien's testing equivocal on the left negative on the right.  No discrete AC joint tenderness to direct palpation or with crossarm adduction on the left-hand side  Specialty Comments:  No specialty comments available.  Imaging: XR  Shoulder Left  Result Date: 07/12/2020 AP outlet axillary left shoulder reviewed.  No acute fracture.  Acromiohumeral distance maintained.  No significant glenohumeral joint arthritis or AC joint arthritis.  Visualized lung fields clear.  Normal left shoulder radiographs.    PMFS History: Patient Active Problem List   Diagnosis Date Noted  . ESSENTIAL HYPERTENSION, BENIGN 12/25/2010   Past Medical History:  Diagnosis Date  . Basal cell cancer    side nose  . Hypertension     No family history on file.  Past Surgical History:  Procedure Laterality Date  . KNEE SURGERY     ACL repair on right  . MENISCUS REPAIR     left knee  . SUPRAVENTRICULAR TACHYCARDIA ABLATION    . TONSILLECTOMY     Social History   Occupational History  . Not on file  Tobacco Use  . Smoking status: Never Smoker  . Smokeless tobacco: Never Used  Substance and Sexual Activity  . Alcohol use: Yes    Alcohol/week: 7.0 - 9.0 standard drinks    Types: 7 - 9 drink(s) per week  . Drug use: No  . Sexual activity: Not on file

## 2020-07-27 DIAGNOSIS — H524 Presbyopia: Secondary | ICD-10-CM | POA: Diagnosis not present

## 2020-08-07 ENCOUNTER — Ambulatory Visit
Admission: RE | Admit: 2020-08-07 | Discharge: 2020-08-07 | Disposition: A | Discharge status: Home / Self Care | Payer: 59 | Source: Ambulatory Visit | Attending: Orthopedic Surgery | Admitting: Orthopedic Surgery

## 2020-08-07 DIAGNOSIS — M7552 Bursitis of left shoulder: Secondary | ICD-10-CM

## 2020-08-07 DIAGNOSIS — M19012 Primary osteoarthritis, left shoulder: Secondary | ICD-10-CM | POA: Diagnosis not present

## 2020-08-07 DIAGNOSIS — M25512 Pain in left shoulder: Secondary | ICD-10-CM

## 2020-08-07 MED ORDER — IOPAMIDOL (ISOVUE-M 200) INJECTION 41%: 12.0000 mL | Freq: Once | INTRAMUSCULAR | Status: DC

## 2020-08-09 NOTE — Progress Notes (Signed)
Hi [name].  I called William [name] about his results.  Plan at this time is intra-articular injection into the glenohumeral joint for resetting of his pain.  Can you please have Dr. 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  Thanks

## 2020-08-09 NOTE — Progress Notes (Signed)
Also can you cancel appointment with me on 08/18/2020 thanks

## 2020-08-12 NOTE — Progress Notes (Signed)
Thanks Fred for getting him in

## 2020-08-18 ENCOUNTER — Ambulatory Visit: Payer: 59 | Admitting: Orthopedic Surgery

## 2020-08-24 ENCOUNTER — Other Ambulatory Visit: Payer: Self-pay

## 2020-08-24 ENCOUNTER — Ambulatory Visit (INDEPENDENT_AMBULATORY_CARE_PROVIDER_SITE_OTHER): Payer: 59 | Admitting: Physical Medicine and Rehabilitation

## 2020-08-24 ENCOUNTER — Ambulatory Visit: Payer: Self-pay

## 2020-08-24 ENCOUNTER — Encounter: Payer: Self-pay | Admitting: Physical Medicine and Rehabilitation

## 2020-08-24 VITALS — BP 130/88 | HR 72

## 2020-08-24 DIAGNOSIS — G8929 Other chronic pain: Secondary | ICD-10-CM | POA: Diagnosis not present

## 2020-08-24 DIAGNOSIS — M25512 Pain in left shoulder: Secondary | ICD-10-CM | POA: Diagnosis not present

## 2020-08-24 NOTE — Progress Notes (Signed)
Pt state left shoulder pain. Pt state move his arm up and down makes the pain worse. Pt state laying on his shoulders hurts.   Numeric Pain Rating Scale and Functional Assessment Average Pain 2   In the last MONTH (on 0-10 scale) has pain interfered with the following?  1. General activity like being  able to carry out your everyday physical activities such as walking, climbing stairs, carrying groceries, or moving a chair?  Rating(6)   +Driver, -BT, -Dye Allergies.

## 2020-08-24 NOTE — Progress Notes (Signed)
William [name] - 59 y.o. male MRN [id number]  Date of birth: Jan 04, 1961  Office Visit Note: Visit Date: 08/24/2020 PCP: Marton Redwood, MD Referred by: Marton Redwood, MD  Subjective: Chief Complaint  Patient presents with  . Left Shoulder - Pain   HPI:  William [name] is a 59 y.o. male who comes in today at the request of Dr. Anderson Malta for planned Left glenohumeral joint injection with fluoroscopic guidance.  He reports this pain now for several months.  He has had MRI arthrogram and that is reviewed below.  He has difficulty doing push-up type maneuvers and abduction.  He does not really have any mechanical complaints.  Has a lot of pain with sleeping on his shoulders.  No real radicular complaints or paresthesias.  The patient has failed conservative care including home exercise, medications, time and activity modification.  This injection will be diagnostic and hopefully therapeutic.  Please see requesting physician notes for further details and justification.   ROS Otherwise per HPI.  Assessment & Plan: Visit Diagnoses:  1. Chronic left shoulder pain     Plan: No additional findings.   Meds & Orders: No orders of the defined types were placed in this encounter.   Orders Placed This Encounter  Procedures  . Large Joint Inj: L glenohumeral  . XR C-ARM NO REPORT    Follow-up: Return for visit to requesting physician as needed.   Procedures: Large Joint Inj: L glenohumeral on 08/24/2020 1:57 PM Indications: pain and diagnostic evaluation Details: 22 G 3.5 in needle, fluoroscopy-guided anteromedial approach  Arthrogram: No  Medications: 3 mL bupivacaine 0.5 %; 60 mg triamcinolone acetonide 40 MG/ML Outcome: tolerated well, no immediate complications  There was excellent flow of contrast producing a partial arthrogram of the glenohumeral joint. The patient did have mild but not dramatic relief of symptoms during the anesthetic phase of the injection. Procedure,  treatment alternatives, risks and benefits explained, specific risks discussed. Consent was given by the patient. Immediately prior to procedure a time out was called to verify the correct patient, procedure, equipment, support staff and site/side marked as required. Patient was prepped and draped in the usual sterile fashion.      No notes on file   Clinical History: MR ARTHROGRAM OF THE LEFT SHOULDER  TECHNIQUE: Multiplanar, multisequence MR imaging of the left shoulder was performed following the administration of intra-articular contrast.  CONTRAST:  See Injection Documentation.  COMPARISON:  Image from contrast injection reviewed. Plain films left shoulder 07/12/2020.  FINDINGS: Rotator cuff: There is heterogeneously increased T2 signal in the cuff tendons consistent with tendinopathy. No tear.  Muscles: Normal without atrophy or focal lesion.  Biceps long head: Intact and normal in appearance.  Acromioclavicular Joint: Moderate osteoarthritis. The acromion is type 2. No contrast extravasates into the subacromial/subdeltoid bursa. There is a small volume of fluid in the bursa.  Glenohumeral Joint: Negative.  Labrum: 4-5 small paralabral cysts are seen along the posterior glenoid indicative of a labral tear. Contrast undermines the posteroinferior and inferior labrum as seen on image 14 and 15 of series 3 and images 7-9 of series 6 consistent with a tear. There is also a small tear of the anterior, superior labrum at the attachment of the long head of biceps.  Bones: No fracture, contusion or worrisome lesion.  IMPRESSION: Posterior to inferior labral tear as described above. There is also a tear of the superior labrum at the biceps tendon attachment.  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  Moderate acromioclavicular osteoarthritis.  Small volume of subacromial/subdeltoid fluid consistent with bursitis.   Electronically Signed   By: Inge Rise M.D.   On: 08/08/2020 11:01     Objective:  VS:  HT:    WT:   BMI:     BP:130/88  HR:72bpm  TEMP: ( )  RESP:  Physical Exam   Imaging: No results found.

## 2020-09-05 MED FILL — ROSUVASTATIN CALCIUM 40 MG: 40 | 90 days supply | Qty: 90 | Fill #3

## 2020-09-05 MED FILL — LISINOPRIL 40 MG TABS: 40 | 90 days supply | Qty: 90 | Fill #2

## 2020-09-19 MED ORDER — TRIAMCINOLONE ACETONIDE 40 MG/ML IJ SUSP
60.0000 mg | INTRAMUSCULAR | Status: AC | PRN
  Administered 2020-08-24: 60 mg via INTRA_ARTICULAR

## 2020-09-19 MED ORDER — BUPIVACAINE HCL 0.5 % IJ SOLN
3.0000 mL | INTRAMUSCULAR | Status: AC | PRN
  Administered 2020-08-24: 3 mL via INTRA_ARTICULAR

## 2020-11-16 DIAGNOSIS — M25512 Pain in left shoulder: Secondary | ICD-10-CM | POA: Diagnosis not present

## 2020-11-16 DIAGNOSIS — N529 Male erectile dysfunction, unspecified: Secondary | ICD-10-CM | POA: Diagnosis not present

## 2020-11-16 DIAGNOSIS — I10 Essential (primary) hypertension: Secondary | ICD-10-CM | POA: Diagnosis not present

## 2020-11-16 DIAGNOSIS — Z8249 Family history of ischemic heart disease and other diseases of the circulatory system: Secondary | ICD-10-CM | POA: Diagnosis not present

## 2020-11-16 DIAGNOSIS — E785 Hyperlipidemia, unspecified: Secondary | ICD-10-CM | POA: Diagnosis not present

## 2020-11-16 DIAGNOSIS — Z0000 Encounter for general adult medical examination without abnormal findings: Secondary | ICD-10-CM | POA: Diagnosis not present

## 2020-11-28 ENCOUNTER — Other Ambulatory Visit (HOSPITAL_COMMUNITY): Payer: Self-pay | Admitting: Internal Medicine

## 2020-11-28 MED FILL — LISINOPRIL 40 MG TABS: 40 | 90 days supply | Qty: 90 | Fill #0

## 2020-11-28 MED FILL — ROSUVASTATIN CALCIUM 40 MG: 40 | 90 days supply | Qty: 90 | Fill #0

## 2020-12-27 ENCOUNTER — Other Ambulatory Visit: Payer: Self-pay

## 2020-12-27 ENCOUNTER — Encounter: Payer: Self-pay | Admitting: Physical Therapy

## 2020-12-27 ENCOUNTER — Ambulatory Visit: Payer: 59 | Attending: Internal Medicine | Admitting: Physical Therapy

## 2020-12-27 DIAGNOSIS — G8929 Other chronic pain: Secondary | ICD-10-CM | POA: Diagnosis not present

## 2020-12-27 DIAGNOSIS — M25512 Pain in left shoulder: Secondary | ICD-10-CM | POA: Diagnosis not present

## 2020-12-27 NOTE — Therapy (Signed)
[facility] [address], [place], [place], [address] Phone: [phone]   Fax:  [phone]  Physical Therapy Evaluation  Patient Details  Name: William [name] MRN: [id number] Date of Birth: [birth year] Referring Provider (PT): Marton Redwood MD   Encounter Date: 12/27/2020   PT End of Session - 12/27/20 1358    Visit Number 1    Date for PT Re-Evaluation 02/21/21    Authorization Type UMR    PT Start Time 1400    PT Stop Time 1452    PT Time Calculation (min) 52 min    Activity Tolerance Patient tolerated treatment well;Patient limited by pain    Behavior During Therapy [facility] for tasks assessed/performed           Past Medical History:  Diagnosis Date  . Basal cell cancer    side nose  . Hypertension     Past Surgical History:  Procedure Laterality Date  . KNEE SURGERY     ACL repair on right  . MENISCUS REPAIR     left knee  . SUPRAVENTRICULAR TACHYCARDIA ABLATION    . TONSILLECTOMY      There were no vitals filed for this visit.    Subjective Assessment - 12/27/20 1403    Subjective Patient has left shoulder pain mainly with ABD for 8 months. Had injection which helped for 4-6 wks but now it's back to hurting. Wakes him up at night    Pertinent History HTN, Rt ACL repair, Lt meniscus repair    Diagnostic tests labral tear; no RC tear    Patient Stated Goals pain to go away and do what I want to do    Currently in Pain? Yes    Pain Score 4     Pain Location Shoulder    Pain Orientation Left    Pain Descriptors / Indicators Sharp    Pain Type Chronic pain    Pain Onset More than a month ago    Pain Frequency Intermittent    Aggravating Factors  lying on it, ABD, push ups    Effect of Pain on Daily Activities unable to work out with pushups, sleep              [facility] PT Assessment - 12/27/20 0001      Assessment   Medical Diagnosis left shoulder pain    Referring Provider (PT) Marton Redwood MD    Onset Date/Surgical Date 04/27/20    Hand Dominance Right    Prior Therapy no      Precautions   Precautions None      Restrictions   Weight Bearing Restrictions No      Balance Screen   Has the patient fallen in the past 6 months No    Has the patient had a decrease in activity level because of a fear of falling?  No    Is the patient reluctant to leave their home because of a fear of falling?  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      Observation/Other Assessments   Focus on Therapeutic Outcomes ([place])  FS = 63; goal 73      Posture/Postural Control   Posture/Postural Control Postural limitations    Postural  Limitations Forward head;Rounded Shoulders;Increased thoracic kyphosis      ROM / Strength   AROM / PROM / Strength AROM;PROM;Strength      AROM   Overall AROM Comments pain with ABD    AROM Assessment Site Shoulder    Right/Left Shoulder Left    Left Shoulder Extension --   full   Left Shoulder Flexion 160 Degrees   standing 150   Left Shoulder ABduction 145 Degrees   130   Left Shoulder Internal Rotation 65 Degrees    Left Shoulder External Rotation --   full     PROM   Overall PROM Comments Pain with end range flexion, IR and ABD    PROM Assessment Site Shoulder    Right/Left Shoulder Left    Left Shoulder Flexion 170 Degrees    Left Shoulder ABduction 163 Degrees    Left Shoulder Internal Rotation 75 Degrees   some pain     Strength   Strength Assessment Site Shoulder    Right/Left Shoulder Left    Left Shoulder Flexion 4+/5    Left Shoulder Extension 5/5    Left Shoulder ABduction 4/5    Left Shoulder Internal Rotation 5/5    Left Shoulder External Rotation 4/5    Left Shoulder Horizontal ABduction 3+/5      Flexibility   Soft Tissue Assessment /Muscle  Length --   tight pectorals and lats     Palpation   Spinal mobility decreased PA mobility throughout thoracic spine    Palpation comment pinpoint tenderness as supraspinatus insertion; else unremarkable      Special Tests   Other special tests neg shoulder special tests                      Objective measurements completed on examination: See above findings.       [facility] Adult PT Treatment/Exercise - 12/27/20 0001      Modalities   Modalities Iontophoresis      Iontophoresis   Type of Iontophoresis Dexamethasone    Location left supraspinatus insertion    Dose 1 ml    Time 4 hour patch                  PT Education - 12/27/20 1502    Education Details HEP; ionto education    Person(s) Educated Patient    Methods Explanation;Demonstration;Handout    Comprehension Verbalized understanding;Returned demonstration            PT Short Term Goals - 12/27/20 1503      PT SHORT TERM GOAL #1   Title Ind with initial HEP    Time 4    Period Weeks    Status New    Target Date 01/24/21      PT SHORT TERM GOAL #2   Title Decreased left shoulder pain with sleeping and ADLs by 25%.    Time 4    Period Weeks    Status New             PT Long Term Goals - 12/27/20 1504      PT LONG TERM GOAL #1   Title Ind with advanced HEP to maintain gains made in PT    Time 8    Period Weeks    Status New    Target Date 02/21/21      PT LONG TERM GOAL #2   Title Patient able to perform ADLS requiring left shoulder ABD with 75% less pain.  Time 8    Period Weeks    Status New      PT LONG TERM GOAL #3   Title Patient to demo improved left shoulder strength to 4+/5 or better to normalize ADLS.    Time 8    Period Weeks    Status New      PT LONG TERM GOAL #4   Title Patient able to sleep without waking from shoulder pain.    Time 8    Period Weeks    Status New      PT LONG TERM GOAL #5   Title Patient able to perform push ups or modified  pushups without increased pain in the left shoulder    Time 8    Period Weeks    Status New      Additional Long Term Goals   Additional Long Term Goals Yes      PT LONG TERM GOAL #6   Title Improved FOTO score to 73    Baseline 63    Time 8    Period Weeks    Status New                  Plan - 12/27/20 1452    Clinical Impression Statement Patient presents with 8 month h/o left shoulder pain of insidious onset mainly with ABD and lying on his shoulder. He is also unable to do pushups without pain. Imaging is positive for labral tear, but no RC tears. He had an injection which helped for about 4-6 weeks but now pain has returned. He has limited left shoulder ROM in flex, IR and ABD and strength deficits in flex, ER, ABD and horizontal ABD. He has decreased thoracic spinal mobility throughout and demonstrates rounded shoulders and forward head posture. Pain interrupts his sleep and limits him from his normal exercise routine. Patient will benefit from PT to address these deficits.    Personal Factors and Comorbidities Comorbidity 1    Comorbidities HTN    Examination-Activity Limitations Sleep;Other   normal workouts   Stability/Clinical Decision Making Stable/Uncomplicated    Clinical Decision Making Moderate    Rehab Potential Excellent    PT Frequency 1x / week    PT Duration 8 weeks    PT Treatment/Interventions ADLs/Self Care Home Management;Cryotherapy;Electrical Stimulation;Iontophoresis 4mg /ml Dexamethasone;Moist Heat;Therapeutic exercise;Patient/family education;Manual techniques;Dry needling;Taping;Joint Manipulations;Spinal Manipulations    PT Next Visit Plan Assess HEP and ionto, shoulder strengthening as tol starting with isometrics, chest opening, lat flexibility, postural strength, thoracic mobility           Patient will benefit from skilled therapeutic intervention in order to improve the following deficits and impairments:  Decreased range of  motion,Pain,Impaired UE functional use,Impaired flexibility,Postural dysfunction,Decreased strength,Hypomobility  Visit Diagnosis: Chronic left shoulder pain     Problem List Patient Active Problem List   Diagnosis Date Noted  . ESSENTIAL HYPERTENSION, BENIGN 12/25/2010    Madelyn Flavors PT 12/27/2020, 3:16 PM  [facility] [address], [place], [place], [address] Phone: [phone]   Fax:  [phone]  Name: William [name] MRN: [id number] Date of Birth: [birth year]

## 2020-12-27 NOTE — Patient Instructions (Addendum)
Access Code: [email or web address] URL: [email or web address] Date: 12/27/2020 Prepared by: Almyra Free  Exercises Standing Isometric Shoulder External Rotation with Doorway - 1 x daily - 7 x weekly - 3 sets - 10 reps Standing Isometric Shoulder Abduction with Doorway - Arm Bent - 1 x daily - 7 x weekly - 3 sets - 5 reps - 10 sec hold Standing Isometric Shoulder Flexion with Doorway - Arm Bent - 1 x daily - 7 x weekly - 3 sets - 10 reps Sidelying Thoracic Rotation with Open Book - 1 x daily - 7 x weekly - 2 sets - 5 reps Thoracic Extension Mobilization with Noodle - 1 x daily - 7 x weekly - 1 sets - 3 reps - 20 sec hold Supine Chest Stretch on Foam Roll - 1 x daily - 7 x weekly - 1 sets - 2 reps - 60 sec hold   IONTOPHORESIS PATIENT PRECAUTIONS & CONTRAINDICATIONS:  . Redness under one or both electrodes can occur.  This characterized by a uniform redness that usually disappears within 12 hours of treatment. . Small pinhead size blisters may result in response to the drug.  Contact your physician if the problem persists more than 24 hours. . On rare occasions, iontophoresis therapy can result in temporary skin reactions such as rash, inflammation, irritation or burns.  The skin reactions may be the result of individual sensitivity to the ionic solution used, the condition of the skin at the start of treatment, reaction to the materials in the electrodes, allergies or sensitivity to dexamethasone, or a poor connection between the patch and your skin.  Discontinue using iontophoresis if you have any of these reactions and report to your therapist. . Remove the Patch or electrodes if you have any undue sensation of pain or burning during the treatment and report discomfort to your therapist. . Tell your Therapist if you have had known adverse reactions to the application of electrical current. . If using the Patch, the LED light will turn off when treatment is complete and the patch can be removed.   Approximate treatment time is 1-3 hours.  Remove the patch when light goes off or after 6 hours. . The Patch can be worn during normal activity, however excessive motion where the electrodes have been placed can cause poor contact between the skin and the electrode or uneven electrical current resulting in greater risk of skin irritation. Marland Kitchen Keep out of the reach of children.   . DO NOT use if you have a cardiac pacemaker or any other electrically sensitive implanted device. . DO NOT use if you have a known sensitivity to dexamethasone. . DO NOT use during Magnetic Resonance Imaging (MRI). . DO NOT use over broken or compromised skin (e.g. sunburn, cuts, or acne) due to the increased risk of skin reaction. . DO NOT SHAVE over the area to be treated:  To establish good contact between the Patch and the skin, excessive hair may be clipped. . DO NOT place the Patch or electrodes on or over your eyes, directly over your heart, or brain. . DO NOT reuse the Patch or electrodes as this may cause burns to occur.

## 2021-01-18 ENCOUNTER — Other Ambulatory Visit: Payer: Self-pay

## 2021-01-18 ENCOUNTER — Ambulatory Visit: Payer: 59 | Admitting: Physical Therapy

## 2021-01-18 DIAGNOSIS — G8929 Other chronic pain: Secondary | ICD-10-CM

## 2021-01-18 DIAGNOSIS — M25512 Pain in left shoulder: Secondary | ICD-10-CM

## 2021-01-18 NOTE — Patient Instructions (Signed)
Access Code: [email or web address] URL: [email or web address] Date: 01/18/2021 Prepared by: Ruben Im  Exercises Standing Isometric Shoulder External Rotation with Doorway - 1 x daily - 7 x weekly - 3 sets - 10 reps Standing Isometric Shoulder Abduction with Doorway - Arm Bent - 1 x daily - 7 x weekly - 3 sets - 5 reps - 10 sec hold Standing Isometric Shoulder Flexion with Doorway - Arm Bent - 1 x daily - 7 x weekly - 3 sets - 10 reps Sidelying Thoracic Rotation with Open Book - 1 x daily - 7 x weekly - 2 sets - 5 reps Thoracic Extension Mobilization with Noodle - 1 x daily - 7 x weekly - 1 sets - 3 reps - 20 sec hold Supine Chest Stretch on Foam Roll - 1 x daily - 7 x weekly - 1 sets - 2 reps - 60 sec hold Sidelying Shoulder Flexion 15 Degrees - 1 x daily - 7 x weekly - 2 sets - 10 reps Sleeper Stretch - 1 x daily - 7 x weekly - 1 sets - 3 reps - 20 hold Supine Scapular Protraction in Flexion with Dumbbells - 1 x daily - 7 x weekly - 3 sets - 10 reps Prone Shoulder Extension - Single Arm with Dumbbell - 1 x daily - 7 x weekly - 3 sets - 10 reps Shoulder Internal External Rotation with Dumbbell - 1 x daily - 7 x weekly - 2 sets - 10 reps Wall Clock with Theraband - 1 x daily - 7 x weekly - 1-2 sets - 10 reps

## 2021-01-18 NOTE — Therapy (Signed)
[facility] [address] [place], [place], [address] Phone: [phone]   Fax:  [phone]  Physical Therapy Treatment  Patient Details  Name: William [name] MRN: [id number] Date of Birth: 03-Jan-1961 Referring Provider (PT): Marton Redwood MD   Encounter Date: 01/18/2021   PT End of Session - 01/18/21 1904    Visit Number 2    Date for PT Re-Evaluation 02/21/21    Authorization Type UMR    PT Start Time 1355    PT Stop Time 1443    PT Time Calculation (min) 48 min    Activity Tolerance Patient tolerated treatment well           Past Medical History:  Diagnosis Date  . Basal cell cancer    side nose  . Hypertension     Past Surgical History:  Procedure Laterality Date  . KNEE SURGERY     ACL repair on right  . MENISCUS REPAIR     left knee  . SUPRAVENTRICULAR TACHYCARDIA ABLATION    . TONSILLECTOMY      There were no vitals filed for this visit.   Subjective Assessment - 01/18/21 1350    Subjective Ex's going OK;  not sure if ionto helped.    Currently in Pain? No/denies    Pain Score 0-No pain    Pain Location Shoulder    Pain Orientation Left                             [facility] Adult PT Treatment/Exercise - 01/18/21 0001      Shoulder Exercises: Supine   Protraction Strengthening;Left;20 reps    Protraction Weight (lbs) 7    Other Supine Exercises review of thoracic extension with foam roll from initial visit      Shoulder Exercises: Seated   External Rotation Strengthening;Left;20 reps    External Rotation Weight (lbs) 2    External Rotation Limitations elbow propped on chair back      Shoulder Exercises: Prone   Extension Strengthening;Left;20 reps    Extension Weight (lbs) 2    Extension Limitations to hip level then scap retract/depress    Other Prone Exercises attempted horizontal row then external rotation but discontinued secondary to pain      Shoulder  Exercises: Sidelying   Flexion Strengthening;Left;20 reps    Flexion Weight (lbs) 2      Shoulder Exercises: Standing   Other Standing Exercises light green loop around hands scoops up/down wall 15x      Shoulder Exercises: Stretch   Cross Chest Stretch 2 reps;20 seconds    Cross Chest Stretch Limitations in left sidelying    Internal Rotation Stretch Limitations left sidelying sleeper stretch 3x 20 sec      Iontophoresis   Type of Iontophoresis Dexamethasone    Location left anterior/lateral shoulder    Dose 1 ml #2    Time 4 hour patch                  PT Education - 01/18/21 1902    Education Details sidelying flexion with weight; serratus punch; prone shoulder extension; seated elbow supported external rotation with weight; wall band ex's    Person(s) Educated Patient    Methods Explanation;Demonstration    Comprehension Returned demonstration;Verbalized understanding            PT Short Term Goals - 12/27/20 1503      PT SHORT  TERM GOAL #1   Title Ind with initial HEP    Time 4    Period Weeks    Status New    Target Date 01/24/21      PT SHORT TERM GOAL #2   Title Decreased left shoulder pain with sleeping and ADLs by 25%.    Time 4    Period Weeks    Status New             PT Long Term Goals - 12/27/20 1504      PT LONG TERM GOAL #1   Title Ind with advanced HEP to maintain gains made in PT    Time 8    Period Weeks    Status New    Target Date 02/21/21      PT LONG TERM GOAL #2   Title Patient able to perform ADLS requiring left shoulder ABD with 75% less pain.    Time 8    Period Weeks    Status New      PT LONG TERM GOAL #3   Title Patient to demo improved left shoulder strength to 4+/5 or better to normalize ADLS.    Time 8    Period Weeks    Status New      PT LONG TERM GOAL #4   Title Patient able to sleep without waking from shoulder pain.    Time 8    Period Weeks    Status New      PT LONG TERM GOAL #5   Title  Patient able to perform push ups or modified pushups without increased pain in the left shoulder    Time 8    Period Weeks    Status New      Additional Long Term Goals   Additional Long Term Goals Yes      PT LONG TERM GOAL #6   Title Improved FOTO score to 73    Baseline 63    Time 8    Period Weeks    Status New                 Plan - 01/18/21 1442    Clinical Impression Statement Discussed goals on treatment including a slow progression within pain limits to promote shoulder stability.  Verbal cues to avoid hyperextension and thumb up positioning with abduction/scaption.  Cues with slow controlled movement and periscapular muscle activation as well.  Therapist closely monitoring response and modifying as needed.    Comorbidities HTN    Examination-Activity Limitations Sleep;Other    Stability/Clinical Decision Making Stable/Uncomplicated    Rehab Potential Excellent    PT Frequency 1x / week    PT Duration 8 weeks    PT Treatment/Interventions ADLs/Self Care Home Management;Cryotherapy;Electrical Stimulation;Iontophoresis 4mg /ml Dexamethasone;Moist Heat;Therapeutic exercise;Patient/family education;Manual techniques;Dry needling;Taping;Joint Manipulations;Spinal Manipulations    PT Next Visit Plan low level shoulder stability ex's;  thoracic mobility; ionto    PT Home Exercise Plan [email or web address]           Patient will benefit from skilled therapeutic intervention in order to improve the following deficits and impairments:  Decreased range of motion,Pain,Impaired UE functional use,Impaired flexibility,Postural dysfunction,Decreased strength,Hypomobility  Visit Diagnosis: Chronic left shoulder pain     Problem List Patient Active Problem List   Diagnosis Date Noted  . ESSENTIAL HYPERTENSION, BENIGN 12/25/2010   Ruben Im, PT 01/18/21 7:10 PM Phone: [phone] Fax: [phone] Alvera Singh 01/18/2021, 7:09 PM  [facility] [address] [place]  [address] [place], [place], [address] Phone: [phone]   Fax:  [phone]  Name: William [name] MRN: [id number] Date of Birth: 15-Jan-1961

## 2021-02-01 ENCOUNTER — Other Ambulatory Visit (HOSPITAL_COMMUNITY): Payer: Self-pay | Admitting: Emergency Medicine

## 2021-02-01 ENCOUNTER — Ambulatory Visit: Payer: 59 | Admitting: Physical Therapy

## 2021-02-16 ENCOUNTER — Other Ambulatory Visit: Payer: Self-pay

## 2021-02-16 ENCOUNTER — Ambulatory Visit: Payer: 59 | Attending: Internal Medicine | Admitting: Physical Therapy

## 2021-02-16 DIAGNOSIS — G8929 Other chronic pain: Secondary | ICD-10-CM | POA: Diagnosis not present

## 2021-02-16 DIAGNOSIS — M25512 Pain in left shoulder: Secondary | ICD-10-CM | POA: Diagnosis not present

## 2021-02-16 NOTE — Therapy (Signed)
[facility] [address] [place], [place], [address] Phone: [phone]   Fax:  [phone]  Physical Therapy Treatment  Patient Details  Name: William [name] MRN: [id number] Date of Birth: 05/28/1961 Referring Provider (PT): Marton Redwood MD   Encounter Date: 02/16/2021   PT End of Session - 02/16/21 1049    Visit Number 3    Date for PT Re-Evaluation 02/21/21    Authorization Type UMR    PT Start Time 1024    PT Stop Time 1104    PT Time Calculation (min) 40 min    Activity Tolerance Patient tolerated treatment well    Behavior During Therapy [facility] for tasks assessed/performed           Past Medical History:  Diagnosis Date  . Basal cell cancer    side nose  . Hypertension     Past Surgical History:  Procedure Laterality Date  . KNEE SURGERY     ACL repair on right  . MENISCUS REPAIR     left knee  . SUPRAVENTRICULAR TACHYCARDIA ABLATION    . TONSILLECTOMY      There were no vitals filed for this visit.   Subjective Assessment - 02/16/21 1025    Subjective It is a lot better than it was, not quite back to normal but a lot better than it was.    Pertinent History HTN, Rt ACL repair, Lt meniscus repair    Diagnostic tests labral tear; no RC tear    Patient Stated Goals pain to go away and do what I want to do    Currently in Pain? No/denies              [facility] PT Assessment - 02/16/21 0001      AROM   Left Shoulder ABduction 153 Degrees   not painful much less                        [facility] Adult PT Treatment/Exercise - 02/16/21 0001      Shoulder Exercises: Supine   Horizontal ABduction Both;20 reps;Theraband    Theraband Level (Shoulder Horizontal ABduction) Level 1 (Yellow)      Shoulder Exercises: Standing   External Rotation Limitations yellow band 20x    Row Strengthening;Both;20 reps;Theraband    Theraband Level (Shoulder Row) Level 1 (Yellow)    Diagonals  Strengthening;Left;10 reps;Theraband    Theraband Level (Shoulder Diagonals) Level 1 (Yellow)   up to 90 deg standing - 10x supine full ROM     Shoulder Exercises: ROM/Strengthening   UBE (Upper Arm Bike) L3 2/2 fwd/back      Shoulder Exercises: Stretch   Corner Stretch 2 reps;30 seconds      Manual Therapy   Manual Therapy Soft tissue mobilization    Soft tissue mobilization pec major/minor Lt side only                  PT Education - 02/16/21 1124    Education Details Access Code: [email or web address]    Person(s) Educated Patient    Methods Explanation;Demonstration;Tactile cues;Verbal cues;Handout    Comprehension Verbalized understanding;Returned demonstration            PT Short Term Goals - 02/16/21 1026      PT SHORT TERM GOAL #1   Title Ind with initial HEP    Status Achieved      PT SHORT TERM GOAL #2   Title Decreased left shoulder  pain with sleeping and ADLs by 25%.    Baseline 75%    Status Achieved             PT Long Term Goals - 12/27/20 1504      PT LONG TERM GOAL #1   Title Ind with advanced HEP to maintain gains made in PT    Time 8    Period Weeks    Status New    Target Date 02/21/21      PT LONG TERM GOAL #2   Title Patient able to perform ADLS requiring left shoulder ABD with 75% less pain.    Time 8    Period Weeks    Status New      PT LONG TERM GOAL #3   Title Patient to demo improved left shoulder strength to 4+/5 or better to normalize ADLS.    Time 8    Period Weeks    Status New      PT LONG TERM GOAL #4   Title Patient able to sleep without waking from shoulder pain.    Time 8    Period Weeks    Status New      PT LONG TERM GOAL #5   Title Patient able to perform push ups or modified pushups without increased pain in the left shoulder    Time 8    Period Weeks    Status New      Additional Long Term Goals   Additional Long Term Goals Yes      PT LONG TERM GOAL #6   Title Improved FOTO score to 73    Baseline 63     Time 8    Period Weeks    Status New                 Plan - 02/16/21 1117    Clinical Impression Statement Pt did well with exercise progression.  He increased abduction ROM to 153 deg without pain.  Overall, he is making progress and HEP updated.  pt states having a lot of success with the stretches and they are feeling good.  Pt needed cues to keep shoulders down and back . He tends to overuse the pec minor to stabilize during exercises.  pt will benefit from skilled PT to continue to work on shoulder stability to return to normal exercise routine.    PT Treatment/Interventions ADLs/Self Care Home Management;Cryotherapy;Electrical Stimulation;Iontophoresis 4mg /ml Dexamethasone;Moist Heat;Therapeutic exercise;Patient/family education;Manual techniques;Dry needling;Taping;Joint Manipulations;Spinal Manipulations    PT Next Visit Plan low level shoulder stability ex's;  thoracic mobility; ionto    PT Home Exercise Plan [email or web address]    Consulted and Agree with Plan of Care Patient           Patient will benefit from skilled therapeutic intervention in order to improve the following deficits and impairments:  Decreased range of motion,Pain,Impaired UE functional use,Impaired flexibility,Postural dysfunction,Decreased strength,Hypomobility  Visit Diagnosis: Chronic left shoulder pain     Problem List Patient Active Problem List   Diagnosis Date Noted  . ESSENTIAL HYPERTENSION, BENIGN 12/25/2010    Jule Ser, PT 02/16/2021, 11:25 AM  [facility] [address] [place], [place], [address] Phone: [phone]   Fax:  [phone]  Name: KYNDAL GLOSTER MRN: [id number] Date of Birth: 10/28/1961

## 2021-02-16 NOTE — Patient Instructions (Signed)
Access Code: [email or web address] URL: [email or web address] Date: 02/16/2021 Prepared by: Jari Favre  Exercises Standing Isometric Shoulder External Rotation with Doorway - 1 x daily - 7 x weekly - 3 sets - 10 reps Standing Isometric Shoulder Abduction with Doorway - Arm Bent - 1 x daily - 7 x weekly - 3 sets - 5 reps - 10 sec hold Standing Isometric Shoulder Flexion with Doorway - Arm Bent - 1 x daily - 7 x weekly - 3 sets - 10 reps Sidelying Thoracic Rotation with Open Book - 1 x daily - 7 x weekly - 2 sets - 5 reps Thoracic Extension Mobilization with Noodle - 1 x daily - 7 x weekly - 1 sets - 3 reps - 20 sec hold Supine Chest Stretch on Foam Roll - 1 x daily - 7 x weekly - 1 sets - 2 reps - 60 sec hold Sidelying Shoulder Flexion 15 Degrees - 1 x daily - 7 x weekly - 2 sets - 10 reps Sleeper Stretch - 1 x daily - 7 x weekly - 1 sets - 3 reps - 20 hold Supine Scapular Protraction in Flexion with Dumbbells - 1 x daily - 7 x weekly - 3 sets - 10 reps Prone Shoulder Extension - Single Arm with Dumbbell - 1 x daily - 7 x weekly - 3 sets - 10 reps Wall Clock with Theraband - 1 x daily - 7 x weekly - 1-2 sets - 10 reps Single Arm Doorway Pec Stretch at 90 Degrees Abduction - 1 x daily - 7 x weekly - 1 sets - 3 reps - 30 hold Seated Upper Extremity Diagonals with Resistance on Swiss Ball - 1 x daily - 7 x weekly - 3 sets - 10 reps Supine Shoulder Horizontal Abduction with Resistance - 1 x daily - 7 x weekly - 3 sets - 10 reps Seated Shoulder W External Rotation on Swiss Ball - 1 x daily - 7 x weekly - 3 sets - 10 reps

## 2021-02-20 ENCOUNTER — Other Ambulatory Visit: Payer: Self-pay

## 2021-02-20 ENCOUNTER — Ambulatory Visit: Payer: 59 | Admitting: Physical Therapy

## 2021-02-20 DIAGNOSIS — G8929 Other chronic pain: Secondary | ICD-10-CM | POA: Diagnosis not present

## 2021-02-20 DIAGNOSIS — M25512 Pain in left shoulder: Secondary | ICD-10-CM | POA: Diagnosis not present

## 2021-02-20 NOTE — Therapy (Signed)
[facility] [address] [place], [place], [address] Phone: [phone]   Fax:  [phone]  Physical Therapy Treatment/Recertification   Patient Details  Name: William [name] MRN: [id number] Date of Birth: Apr 24, 1961 Referring Provider (PT): Marton Redwood MD   Encounter Date: 02/20/2021   PT End of Session - 02/20/21 1654    Visit Number 4    Date for PT Re-Evaluation 04/17/21    Authorization Type UMR    PT Start Time 1400    PT Stop Time 1445    PT Time Calculation (min) 45 min    Activity Tolerance Patient tolerated treatment well           Past Medical History:  Diagnosis Date  . Basal cell cancer    side nose  . Hypertension     Past Surgical History:  Procedure Laterality Date  . KNEE SURGERY     ACL repair on right  . MENISCUS REPAIR     left knee  . SUPRAVENTRICULAR TACHYCARDIA ABLATION    . TONSILLECTOMY      There were no vitals filed for this visit.   Subjective Assessment - 02/20/21 1645    Subjective My shoulder is really doing  better.  I can sleep on that side now.  Reports he tried bench pressing > 100# but it was painful.  Hopeful he can get back to doing burpee ex's or maybe push ups.  Missed last week b/c his mother passed away.    Pertinent History HTN, Rt ACL repair, Lt meniscus repair    Diagnostic tests labral tear; no RC tear    Patient Stated Goals pain to go away and do what I want to do    Currently in Pain? No/denies    Pain Score 0-No pain    Pain Location Shoulder    Pain Orientation Left              [facility] PT Assessment - 02/20/21 0001      Observation/Other Assessments   Focus on Therapeutic Outcomes (FOTO)  74%      AROM   Left Shoulder Flexion 164 Degrees    Left Shoulder ABduction 176 Degrees    Left Shoulder Internal Rotation --   T8   Left Shoulder External Rotation 78 Degrees   at 90 degrees 80     Strength   Left Shoulder Flexion 5/5    Left  Shoulder Extension 5/5    Left Shoulder ABduction 4/5    Left Shoulder Internal Rotation 5/5    Left Shoulder External Rotation 4/5    Left Shoulder Horizontal ABduction 4/5                         [facility] Adult PT Treatment/Exercise - 02/20/21 0001      Shoulder Exercises: Prone   Horizontal ABduction 1 AROM;20 reps;Both    Horizontal ABduction 1 Limitations leaning over red ball    Other Prone Exercises leaning over red ball with 90 degree row then external rotation 2x 10    Other Prone Exercises tall plank with 10# kettlebell pull throughs alternating sides      Shoulder Exercises: Sidelying   Other Sidelying Exercises side plank with cue for "little lift of ribcage first"      Shoulder Exercises: Standing   Other Standing Exercises red band around wrists with wall clocks 10x    Other Standing Exercises red band around wrists  with wall slides and lift offs at the top 2x 10      Shoulder Exercises: ROM/Strengthening   Pushups 20 reps    Pushups Limitations countertop    Other ROM/Strengthening Exercises upside down kettlebell carries at 90 degrees shoulder and 90 degrees elbow 50 feet                  PT Education - 02/20/21 1653    Education Details counter push ups; UE weight bearing; wall slides with red band around wrists; upside down 10# kettlebell carries    Person(s) Educated Patient    Methods Explanation;Demonstration;Handout    Comprehension Returned demonstration;Verbalized understanding            PT Short Term Goals - 02/20/21 1700      PT SHORT TERM GOAL #1   Title Ind with initial HEP    Status Achieved      PT SHORT TERM GOAL #2   Title Decreased left shoulder pain with sleeping and ADLs by 25%.    Status Achieved             PT Long Term Goals - 02/20/21 1700      PT LONG TERM GOAL #1   Title Ind with advanced HEP to maintain gains made in PT    Time 8    Period Weeks    Status On-going    Target Date 04/17/21       PT LONG TERM GOAL #2   Title Patient able to perform ADLS requiring left shoulder ABD with 75% less pain.    Time 8    Period Weeks    Status On-going      PT LONG TERM GOAL #3   Title Patient to demo improved left shoulder strength to 4+/5 or better to normalize ADLS.    Time 8    Period Weeks    Status On-going      PT LONG TERM GOAL #4   Title Patient able to sleep without waking from shoulder pain.    Status Achieved      PT LONG TERM GOAL #5   Title Patient able to perform push ups or modified pushups without increased pain in the left shoulder    Time 8    Period Weeks    Status On-going      PT LONG TERM GOAL #6   Title Improved FOTO score to 73    Status Achieved                 Plan - 02/20/21 1702    Clinical Impression Statement The  patient is progressing well with improvements in left shoulder ROM in all planes and no pain now with abduction.  Pain only produced with external rotation at endrange in 90 degrees of abduction.  His strength is improving as well with deficits at endrange external rotation and with resisted abduction.  He is pleased with his progress so far but is hopeful he can return to his previous high load ex program (burpees, push ups, planking). He is now able to sleep on his left side without pain.  FOTO score has improved from 63% to 74%.  he is able to perform a progression of strengthening including UE weight bearing with minimal to no pain.  He would benefit from additional PT to further progress his ex intensity under supervision.    Personal Factors and Comorbidities Comorbidity 1    Comorbidities HTN    Examination-Activity Limitations  Sleep;Other    Stability/Clinical Decision Making Stable/Uncomplicated    Rehab Potential Excellent    PT Frequency 1x / week    PT Duration 8 weeks    PT Treatment/Interventions ADLs/Self Care Home Management;Cryotherapy;Electrical Stimulation;Iontophoresis 4mg /ml Dexamethasone;Moist Heat;Therapeutic  exercise;Patient/family education;Manual techniques;Dry needling;Taping;Joint Manipulations;Spinal Manipulations    PT Next Visit Plan moderate level shoulder stability ex's including weight bearing ;  thoracic mobility    PT Home Exercise Plan [id number]    Consulted and Agree with Plan of Care Patient           Patient will benefit from skilled therapeutic intervention in order to improve the following deficits and impairments:  Decreased range of motion,Pain,Impaired UE functional use,Impaired flexibility,Postural dysfunction,Decreased strength,Hypomobility  Visit Diagnosis: Chronic left shoulder pain - Plan: PT plan of care cert/re-cert     Problem List Patient Active Problem List   Diagnosis Date Noted  . ESSENTIAL HYPERTENSION, BENIGN 12/25/2010   Ruben Im, PT 02/20/21 5:08 PM Phone: [phone] Fax: [phone] Alvera Singh 02/20/2021, 5:08 PM  [facility] [address], [place], [place], [address] Phone: [phone]   Fax:  [phone]  Name: William [name] MRN: [id number] Date of Birth: [birth year]

## 2021-02-20 NOTE — Patient Instructions (Signed)
Access Code: [email or web address] URL: [email or web address] Date: 02/20/2021 Prepared by: Ruben Im  Exercises Standing Isometric Shoulder External Rotation with Doorway - 1 x daily - 7 x weekly - 3 sets - 10 reps Standing Isometric Shoulder Abduction with Doorway - Arm Bent - 1 x daily - 7 x weekly - 3 sets - 5 reps - 10 sec hold Standing Isometric Shoulder Flexion with Doorway - Arm Bent - 1 x daily - 7 x weekly - 3 sets - 10 reps Sidelying Thoracic Rotation with Open Book - 1 x daily - 7 x weekly - 2 sets - 5 reps Thoracic Extension Mobilization with Noodle - 1 x daily - 7 x weekly - 1 sets - 3 reps - 20 sec hold Supine Chest Stretch on Foam Roll - 1 x daily - 7 x weekly - 1 sets - 2 reps - 60 sec hold Sidelying Shoulder Flexion 15 Degrees - 1 x daily - 7 x weekly - 2 sets - 10 reps Sleeper Stretch - 1 x daily - 7 x weekly - 1 sets - 3 reps - 20 hold Supine Scapular Protraction in Flexion with Dumbbells - 1 x daily - 7 x weekly - 3 sets - 10 reps Prone Shoulder Extension - Single Arm with Dumbbell - 1 x daily - 7 x weekly - 3 sets - 10 reps Wall Clock with Theraband - 1 x daily - 7 x weekly - 1-2 sets - 10 reps Single Arm Doorway Pec Stretch at 90 Degrees Abduction - 1 x daily - 7 x weekly - 1 sets - 3 reps - 30 hold Seated Upper Extremity Diagonals with Resistance on Swiss Ball - 1 x daily - 7 x weekly - 3 sets - 10 reps Supine Shoulder Horizontal Abduction with Resistance - 1 x daily - 7 x weekly - 3 sets - 10 reps Seated Shoulder W External Rotation on Swiss Ball - 1 x daily - 7 x weekly - 3 sets - 10 reps Push Up on Table - 1 x daily - 7 x weekly - 2 sets - 10 reps Full Plank with Shoulder Taps - 1 x daily - 7 x weekly - 2 sets - 10 reps Kettlebell Bottom Up Carry - 1 x daily - 7 x weekly - 2 sets - 10 reps Prone on Swiss Ball Shoulder Row to ER to Overhead Reach w/o weight - 1 x daily - 7 x weekly - 2 sets - 10 reps Standing shoulder flexion wall slides - 1 x daily - 7 x  weekly - 2 sets - 10 reps Standing Plank on Wall with Reaches and Resistance - 1 x daily - 7 x weekly - 2 sets - 10 reps

## 2021-02-21 ENCOUNTER — Other Ambulatory Visit (HOSPITAL_COMMUNITY): Payer: Self-pay

## 2021-02-21 MED FILL — Rosuvastatin Calcium Tab 40 MG: ORAL | 90 days supply | Qty: 90 | Fill #0 | Status: AC

## 2021-02-21 MED FILL — Lisinopril Tab 40 MG: ORAL | 90 days supply | Qty: 90 | Fill #0 | Status: AC

## 2021-03-01 ENCOUNTER — Other Ambulatory Visit: Payer: Self-pay

## 2021-03-01 ENCOUNTER — Ambulatory Visit: Payer: 59 | Attending: Internal Medicine | Admitting: Physical Therapy

## 2021-03-01 ENCOUNTER — Encounter: Payer: Self-pay | Admitting: Physical Therapy

## 2021-03-01 DIAGNOSIS — M25512 Pain in left shoulder: Secondary | ICD-10-CM | POA: Insufficient documentation

## 2021-03-01 DIAGNOSIS — G8929 Other chronic pain: Secondary | ICD-10-CM | POA: Insufficient documentation

## 2021-03-01 NOTE — Therapy (Signed)
[facility] [address], [place], [place], [address] Phone: [phone]   Fax:  [phone]  Physical Therapy Treatment  Patient Details  Name: William [name] MRN: [id number] Date of Birth: January 11, 1961 Referring Provider (PT): Marton Redwood MD   Encounter Date: 03/01/2021   PT End of Session - 03/01/21 0807    Visit Number 5    Date for PT Re-Evaluation 04/17/21    Authorization Type UMR    PT Start Time 0802    PT Stop Time 0843    PT Time Calculation (min) 41 min    Activity Tolerance Patient tolerated treatment well    Behavior During Therapy [facility] for tasks assessed/performed           Past Medical History:  Diagnosis Date  . Basal cell cancer    side nose  . Hypertension     Past Surgical History:  Procedure Laterality Date  . KNEE SURGERY     ACL repair on right  . MENISCUS REPAIR     left knee  . SUPRAVENTRICULAR TACHYCARDIA ABLATION    . TONSILLECTOMY      There were no vitals filed for this visit.   Subjective Assessment - 03/01/21 0809    Subjective I am a little sore in general but much better    Pertinent History HTN, Rt ACL repair, Lt meniscus repair    Diagnostic tests labral tear; no RC tear    Patient Stated Goals pain to go away and do what I want to do    Currently in Pain? Yes    Pain Score 2     Pain Location Shoulder    Pain Orientation Left    Pain Descriptors / Indicators Discomfort    Pain Type Chronic pain    Pain Onset More than a month ago    Pain Frequency Intermittent    Aggravating Factors  certain exercises - diagonals, ER    Multiple Pain Sites No                             [facility] Adult PT Treatment/Exercise - 03/01/21 0001      Shoulder Exercises: Prone   Other Prone Exercises shoulder ext, row - 3lb; shoulder abd 2lb; flexion no resistance - 20x each    Other Prone Exercises plank on red pball with movement up/down/sides - 10x each       Shoulder Exercises: Standing   Other Standing Exercises 15 lb - UE 90 flex 15# - 20x each way    Other Standing Exercises red ER Lt - 15 (feel hard)      Shoulder Exercises: ROM/Strengthening   UBE (Upper Arm Bike) L3 3/2 fwd/back                    PT Short Term Goals - 02/20/21 1700      PT SHORT TERM GOAL #1   Title Ind with initial HEP    Status Achieved      PT SHORT TERM GOAL #2   Title Decreased left shoulder pain with sleeping and ADLs by 25%.    Status Achieved             PT Long Term Goals - 02/20/21 1700      PT LONG TERM GOAL #1   Title Ind with advanced HEP to maintain gains made in PT    Time 8  Period Weeks    Status On-going    Target Date 04/17/21      PT LONG TERM GOAL #2   Title Patient able to perform ADLS requiring left shoulder ABD with 75% less pain.    Time 8    Period Weeks    Status On-going      PT LONG TERM GOAL #3   Title Patient to demo improved left shoulder strength to 4+/5 or better to normalize ADLS.    Time 8    Period Weeks    Status On-going      PT LONG TERM GOAL #4   Title Patient able to sleep without waking from shoulder pain.    Status Achieved      PT LONG TERM GOAL #5   Title Patient able to perform push ups or modified pushups without increased pain in the left shoulder    Time 8    Period Weeks    Status On-going      PT LONG TERM GOAL #6   Title Improved FOTO score to 73    Status Achieved                 Plan - 03/01/21 0844    Clinical Impression Statement Pt was able to progress exercises with planks on ball with perturbations.  Pt needs min cues throughout for shoulder position and movements to ensure he is doing correctly. Pt continues to report feeling much better and states he is at least 70-80% improved.  Pt still getting some soreness from moderate level exercises and not fully back to normal activitiy level. He will benefit from skilled PT to progress shoulder strength and  stability.    PT Treatment/Interventions ADLs/Self Care Home Management;Cryotherapy;Electrical Stimulation;Iontophoresis 4mg /ml Dexamethasone;Moist Heat;Therapeutic exercise;Patient/family education;Manual techniques;Dry needling;Taping;Joint Manipulations;Spinal Manipulations    PT Next Visit Plan moderate level shoulder stability ex's including weight bearing ;  thoracic mobility    PT Home Exercise Plan [id number]    Consulted and Agree with Plan of Care Patient           Patient will benefit from skilled therapeutic intervention in order to improve the following deficits and impairments:  Decreased range of motion,Pain,Impaired UE functional use,Impaired flexibility,Postural dysfunction,Decreased strength,Hypomobility  Visit Diagnosis: Chronic left shoulder pain     Problem List Patient Active Problem List   Diagnosis Date Noted  . ESSENTIAL HYPERTENSION, BENIGN 12/25/2010    Jule Ser, PT 03/01/2021, 8:46 AM  [facility] [address] [place], [place], [address] Phone: [phone]   Fax:  [phone]  Name: William [name] MRN: [id number] Date of Birth: 1961/07/04

## 2021-03-15 ENCOUNTER — Ambulatory Visit: Payer: 59 | Admitting: Physical Therapy

## 2021-03-15 ENCOUNTER — Other Ambulatory Visit: Payer: Self-pay

## 2021-03-15 DIAGNOSIS — M25512 Pain in left shoulder: Secondary | ICD-10-CM | POA: Diagnosis not present

## 2021-03-15 DIAGNOSIS — G8929 Other chronic pain: Secondary | ICD-10-CM | POA: Diagnosis not present

## 2021-03-15 NOTE — Therapy (Addendum)
[facility] [address], [place], [place], [address] Phone: [phone]   Fax:  [phone]  Physical Therapy Treatment/Discharge Summary   Patient Details  Name: William [name] MRN: [id number] Date of Birth: 02/08/1961 Referring Provider (PT): Marton Redwood MD   Encounter Date: 03/15/2021   PT End of Session - 03/15/21 1913     Visit Number 6    Date for PT Re-Evaluation 04/17/21    Authorization Type UMR    PT Start Time 1355    PT Stop Time 1435    PT Time Calculation (min) 40 min    Activity Tolerance Patient tolerated treatment well             Past Medical History:  Diagnosis Date   Basal cell cancer    side nose   Hypertension     Past Surgical History:  Procedure Laterality Date   KNEE SURGERY     ACL repair on right   MENISCUS REPAIR     left knee   SUPRAVENTRICULAR TACHYCARDIA ABLATION     TONSILLECTOMY      There were no vitals filed for this visit.   Subjective Assessment - 03/15/21 1352     Subjective No pain at rest.  Some pain with abduction ROM.  Haven't attempted push ups and bench press.  Still a click in there.    Pertinent History HTN, Rt ACL repair, Lt meniscus repair    Patient Stated Goals pain to go away and do what I want to do    Currently in Pain? No/denies    Pain Score 0-No pain    Pain Location Shoulder    Pain Orientation Left                               [facility] Adult PT Treatment/Exercise - 03/15/21 0001       Self-Care   Self-Care --   discussed slow return of more challenging movements     Shoulder Exercises: Supine   Other Supine Exercises 2 5# weight bench presses 15x      Shoulder Exercises: Seated   External Rotation Strengthening;Left;20 reps    Theraband Level (Shoulder External Rotation) Level 1 (Yellow)    External Rotation Limitations elbow propped on chair back    Other Seated Exercises yellow band scaption 2x 10    instructed in thumb up for dec pain     Shoulder Exercises: Prone   Other Prone Exercises planks with hand taps; inclined push ups 10x;    Other Prone Exercises prone over green ball walkouts      Shoulder Exercises: Standing   Other Standing Exercises 7# bicep curls 2x10      Shoulder Exercises: ROM/Strengthening   UBE (Upper Arm Bike) L3 2/2 fwd/back    Other ROM/Strengthening Exercises holding 7# at ear level for stability      Shoulder Exercises: Stretch   Cross Chest Stretch 2 reps;20 seconds    Cross Chest Stretch Limitations standing                      PT Short Term Goals - 02/20/21 1700       PT SHORT TERM GOAL #1   Title Ind with initial HEP    Status Achieved      PT SHORT TERM GOAL #2   Title Decreased left shoulder pain with sleeping and ADLs by  25%.    Status Achieved               PT Long Term Goals - 03/15/21 1925       PT LONG TERM GOAL #1   Title Ind with advanced HEP to maintain gains made in PT    Time 8    Period Weeks    Status On-going      PT LONG TERM GOAL #2   Title Patient able to perform ADLS requiring left shoulder ABD with 75% less pain.    Status Achieved      PT LONG TERM GOAL #3   Title Patient to demo improved left shoulder strength to 4+/5 or better to normalize ADLS.    Time 8    Period Weeks    Status Partially Met      PT LONG TERM GOAL #4   Title Patient able to sleep without waking from shoulder pain.    Status Achieved      PT LONG TERM GOAL #5   Title Patient able to perform push ups or modified pushups without increased pain in the left shoulder    Status Achieved      PT LONG TERM GOAL #6   Title Improved FOTO score to 73    Status Achieved                   Plan - 03/15/21 1913     Clinical Impression Statement Patient reports significantly improved shoulder pain and function.  He continues to perform his HEP without difficulty except some discomfort with scaption with band  (recommended thumb up instead of down which relieved discomfort).  He expresses he wants to return to push ups,burpees and bench pressing but is concerned about reinjuring his shoullder.  We discussed a slow progression back to these with modifications like incline on bench push ups. lighter weight, smaller ROM, knees down etc.  Anticipate readiness for discharge next visit.    Rehab Potential Excellent    PT Frequency 1x / week    PT Duration 8 weeks    PT Treatment/Interventions ADLs/Self Care Home Management;Cryotherapy;Electrical Stimulation;Iontophoresis 39m/ml Dexamethasone;Moist Heat;Therapeutic exercise;Patient/family education;Manual techniques;Dry needling;Taping;Joint Manipulations;Spinal Manipulations    PT Next Visit Plan review HEP;  probable discharge next visit    [place]            Patient will benefit from skilled therapeutic intervention in order to improve the following deficits and impairments:  Decreased range of motion,Pain,Impaired UE functional use,Impaired flexibility,Postural dysfunction,Decreased strength,Hypomobility  Visit Diagnosis: Chronic left shoulder pain    Problem List Patient Active Problem List   Diagnosis Date Noted   ESSENTIAL HYPERTENSION, BENIGN 12/25/2010   SRuben Im PT 03/15/21 7:26 PM Phone: [phone]: [phone] Singh5/19/2022, 7:26 PM  [place] [place] [address]Phone: [phone]  Fax:  [phone] Name: William RAUCCIMRN: [id number] of Birth: 7July 14, 1962 PHYSICAL THERAPY DISCHARGE SUMMARY  Visits from Start of Care: 6  Current functional level related to goals / functional outcomes: See above details   Remaining deficits: See above details   Education / Equipment: HEP   Patient agrees to discharge. Patient goals were partially met. Patient is being discharged due to  not returning since the last visit.  JGustavus Bryant PT 04/10/21 12:34 PM

## 2021-03-21 ENCOUNTER — Other Ambulatory Visit (HOSPITAL_COMMUNITY): Payer: 59

## 2021-03-21 ENCOUNTER — Ambulatory Visit (HOSPITAL_COMMUNITY)
Admission: RE | Admit: 2021-03-21 | Discharge: 2021-03-21 | Disposition: A | Discharge status: Home / Self Care | Payer: Self-pay | Source: Ambulatory Visit | Attending: Cardiology | Admitting: Cardiology

## 2021-03-21 ENCOUNTER — Other Ambulatory Visit: Payer: Self-pay

## 2021-03-21 ENCOUNTER — Encounter: Payer: 59 | Admitting: Physical Therapy

## 2021-04-05 DIAGNOSIS — Z85828 Personal history of other malignant neoplasm of skin: Secondary | ICD-10-CM | POA: Diagnosis not present

## 2021-04-05 DIAGNOSIS — L720 Epidermal cyst: Secondary | ICD-10-CM | POA: Diagnosis not present

## 2021-05-17 ENCOUNTER — Other Ambulatory Visit (HOSPITAL_COMMUNITY): Payer: Self-pay

## 2021-05-17 MED FILL — Lisinopril Tab 40 MG: ORAL | 90 days supply | Qty: 90 | Fill #1 | Status: AC

## 2021-05-17 MED FILL — Rosuvastatin Calcium Tab 40 MG: ORAL | 90 days supply | Qty: 90 | Fill #1 | Status: AC

## 2021-08-06 DIAGNOSIS — H5213 Myopia, bilateral: Secondary | ICD-10-CM | POA: Diagnosis not present

## 2021-08-28 ENCOUNTER — Other Ambulatory Visit (HOSPITAL_COMMUNITY): Payer: Self-pay

## 2021-08-28 MED FILL — Rosuvastatin Calcium Tab 40 MG: ORAL | 90 days supply | Qty: 90 | Fill #2 | Status: AC

## 2021-08-28 MED FILL — Lisinopril Tab 40 MG: ORAL | 90 days supply | Qty: 90 | Fill #2 | Status: AC

## 2021-09-24 ENCOUNTER — Other Ambulatory Visit (HOSPITAL_COMMUNITY): Payer: Self-pay

## 2021-09-24 ENCOUNTER — Other Ambulatory Visit: Payer: Self-pay | Admitting: *Deleted

## 2021-09-24 MED ORDER — AMOXICILLIN-POT CLAVULANATE 500-125 MG PO TABS
1.0000 | ORAL_TABLET | Freq: Three times a day (TID) | ORAL | 0 refills | Status: DC
  Filled 2021-09-24: qty 21, 7d supply, fill #0

## 2021-11-01 NOTE — Progress Notes (Signed)
[place] Phone: [phone] Subjective:   William [name], am serving as a scribe for Dr. Hulan Saas. This visit occurred during the SARS-CoV-2 public health emergency.  Safety protocols were in place, including screening questions prior to the visit, additional usage of staff PPE, and extensive cleaning of exam room while observing appropriate contact time as indicated for disinfecting solutions.  I'm seeing this patient by the request  of:  Ginger Organ., MD  CC: Bilateral foot pain  [id number]  William [name] is a 61 y.o. male coming in with complaint of L metatarsalgia and R 1st MTP pain as well. R ACL repair in 1993. L meniscus over decade. Has pain in L knee is minor. Unable to extend L knee. Has not done any PT recently.        Past Medical History:  Diagnosis Date   Basal cell cancer    side nose   Hypertension    Past Surgical History:  Procedure Laterality Date   KNEE SURGERY     ACL repair on right   MENISCUS REPAIR     left knee   SUPRAVENTRICULAR TACHYCARDIA ABLATION     TONSILLECTOMY     Social History   Socioeconomic History   Marital status: Married    Spouse name: Not on file   Number of children: Not on file   Years of education: Not on file   Highest education level: Not on file  Occupational History   Not on file  Tobacco Use   Smoking status: Never   Smokeless tobacco: Never  Substance and Sexual Activity   Alcohol use: Yes    Alcohol/week: 7.0 - 9.0 standard drinks    Types: 7 - 9 drink(s) per week   Drug use: [name]   Sexual activity: Not on file  Other Topics Concern   Not on file  Social History Narrative   Not on file   Social Determinants of Health   Financial Resource Strain: Not on file  Food Insecurity: Not on file  Transportation Needs: Not on file  Physical Activity: Not on file  Stress: Not on file  Social Connections: Not on file   [name]  Known Allergies [name] family history on file.   Current Outpatient Medications (Cardiovascular):    lisinopril (ZESTRIL) 40 MG tablet, TAKE 1 TABLET BY MOUTH ONCE A DAY   rosuvastatin (CRESTOR) 40 MG tablet, TAKE 1 TABLET BY MOUTH ONCE A DAY   Current Outpatient Medications (Analgesics):    meloxicam (MOBIC) 15 MG tablet, Take 1 tablet (15 mg total) by mouth daily.   Current Outpatient Medications (Other):    Vitamin D, Ergocalciferol, (DRISDOL) 1.25 MG (50000 UNIT) CAPS capsule, Take 1 capsule (50,000 Units total) by mouth every 7 (seven) days.   Reviewed prior external information including notes and imaging from  primary care provider As well as notes that were available from care everywhere and other healthcare systems.  Past medical history, social, surgical and family history all reviewed in electronic medical record.  [name] pertanent information unless stated regarding to the chief complaint.   Review of Systems:  [name] headache, visual changes, nausea, vomiting, diarrhea, constipation, dizziness, abdominal pain, skin rash, fevers, chills, night sweats, weight loss, swollen lymph nodes, body aches, joint swelling, chest pain, shortness of breath, mood changes. POSITIVE muscle aches  Objective  Blood pressure 124/82, pulse 81, height 6' (1.829 m), weight 207 lb (93.9 kg),  SpO2 97 %.   General: [name] apparent distress alert and oriented x3 mood and affect normal, dressed appropriately.  HEENT: Pupils equal, extraocular movements intact  Respiratory: Patient's speak in full sentences and does not appear short of breath  Cardiovascular: [name] lower extremity edema, non tender, [name] erythema  Gait normal with good balance and coordination.  MSK: Foot exam does show the patient does have breakdown of the transverse arch noted bilaterally.  Significant hallux limitus noted of the right foot.  Patient does have very mild positive squeeze test on the left foot.  Patient does have tenderness over the  metatarsal heads are noted.  Limited muscular skeletal ultrasound was performed and interpreted by Hulan Saas, M  Limited ultrasound shows a capsulitis of the first metatarsal.  Moderate arthritic changes.  Patient does have hypoechoic changes of the heads of the metatarsals with [name] significant neuroma noted. Impression: Metatarsalgia with bursitis and mild arthritic changes of the fifth metatarsal   Impression and Recommendations:     The above documentation has been reviewed and is accurate and complete William Pulley, DO

## 2021-11-02 ENCOUNTER — Other Ambulatory Visit: Payer: Self-pay

## 2021-11-02 ENCOUNTER — Ambulatory Visit (INDEPENDENT_AMBULATORY_CARE_PROVIDER_SITE_OTHER): Payer: 59 | Admitting: Family Medicine

## 2021-11-02 ENCOUNTER — Encounter: Payer: Self-pay | Admitting: Family Medicine

## 2021-11-02 ENCOUNTER — Other Ambulatory Visit (HOSPITAL_COMMUNITY): Payer: Self-pay

## 2021-11-02 DIAGNOSIS — M79671 Pain in right foot: Secondary | ICD-10-CM | POA: Diagnosis not present

## 2021-11-02 DIAGNOSIS — M2021 Hallux rigidus, right foot: Secondary | ICD-10-CM | POA: Insufficient documentation

## 2021-11-02 DIAGNOSIS — M216X9 Other acquired deformities of unspecified foot: Secondary | ICD-10-CM

## 2021-11-02 DIAGNOSIS — M79672 Pain in left foot: Secondary | ICD-10-CM | POA: Diagnosis not present

## 2021-11-02 MED ORDER — MELOXICAM 15 MG PO TABS
15.0000 mg | ORAL_TABLET | Freq: Every day | ORAL | 0 refills | Status: DC
  Filled 2021-11-02: qty 30, 30d supply, fill #0

## 2021-11-02 MED ORDER — VITAMIN D (ERGOCALCIFEROL) 1.25 MG (50000 UNIT) PO CAPS
50000.0000 [IU] | ORAL_CAPSULE | ORAL | 0 refills | Status: DC
  Filled 2021-11-02: qty 8, 56d supply, fill #0

## 2021-11-02 NOTE — Patient Instructions (Signed)
Spenco Total Support Hoka Bondhi or Arahi Hoka recovery sandal Meloxicam 15mg  daily for 10 days then as needed Exercises for transverse arch Lower impact exercise for now See me in 5-6 weeks

## 2021-11-02 NOTE — Assessment & Plan Note (Signed)
Patient does have a conglomeration of multiple problems.  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  At this point we will try over-the-counter orthotics, proper shoes, recovery sandals, meloxicam, once weekly vitamin D.  Meloxicam.  Would like patient to start increasing activity with more lower impact initially and then we will get him back to running.  Follow-up again in 5 to 6 weeks

## 2021-11-28 ENCOUNTER — Other Ambulatory Visit (HOSPITAL_COMMUNITY): Payer: Self-pay

## 2021-12-03 ENCOUNTER — Other Ambulatory Visit (HOSPITAL_COMMUNITY): Payer: Self-pay

## 2021-12-03 MED ORDER — ROSUVASTATIN CALCIUM 40 MG PO TABS
40.0000 mg | ORAL_TABLET | Freq: Every day | ORAL | 4 refills | Status: DC
  Filled 2021-12-03: qty 90, 90d supply, fill #0
  Filled 2022-02-14: qty 90, 90d supply, fill #1
  Filled 2022-05-14: qty 90, 90d supply, fill #2
  Filled 2022-08-11: qty 90, 90d supply, fill #3
  Filled 2022-11-12: qty 90, 90d supply, fill #4

## 2021-12-03 MED ORDER — LISINOPRIL 40 MG PO TABS
40.0000 mg | ORAL_TABLET | Freq: Every day | ORAL | 4 refills | Status: DC
  Filled 2021-12-03: qty 90, 90d supply, fill #0
  Filled 2022-02-14: qty 90, 90d supply, fill #1
  Filled 2022-05-14: qty 90, 90d supply, fill #2
  Filled 2022-08-11: qty 90, 90d supply, fill #3
  Filled 2022-11-12: qty 90, 90d supply, fill #4

## 2021-12-04 NOTE — Progress Notes (Signed)
[facility] [place] Phone: [phone] Subjective:   William [name], am serving as a scribe for Dr. Hulan Saas. This visit occurred during the SARS-CoV-2 public health emergency.  Safety protocols were in place, including screening questions prior to the visit, additional usage of staff PPE, and extensive cleaning of exam room while observing appropriate contact time as indicated for disinfecting solutions.  I'm seeing this patient by the request  of:  Ginger Organ., MD  CC: foot pain   BSJ:[id number]  11/02/2021 Patient does have a conglomeration of multiple problems.  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  At this point we will try over-the-counter orthotics, proper shoes, recovery sandals, meloxicam, once weekly vitamin D.  Meloxicam.  Would like patient to start increasing activity with more lower impact initially and then we will get him back to running.  Follow-up again in 5 to 6 weeks  Updated 12/05/2021 William [name] is a 61 y.o. male coming in with complaint of B foot pain. Patient states that he is feeling somewhat better. HOKA shoes have been helpful. Burning sensation in ball of foot with walking but this subsides when he sits down.  Patient with state overall feeling approximately 40% better.       Past Medical History:  Diagnosis Date   Basal cell cancer    side nose   Hypertension    Past Surgical History:  Procedure Laterality Date   KNEE SURGERY     ACL repair on right   MENISCUS REPAIR     left knee   SUPRAVENTRICULAR TACHYCARDIA ABLATION     TONSILLECTOMY     Social History   Socioeconomic History   Marital status: Married    Spouse name: Not on file   Number of children: Not on file   Years of education: Not on file   Highest education  level: Not on file  Occupational History   Not on file  Tobacco Use   Smoking status: Never   Smokeless tobacco: Never  Substance and Sexual Activity   Alcohol use: Yes    Alcohol/week: 7.0 - 9.0 standard drinks    Types: 7 - 9 drink(s) per week   Drug use: [name]   Sexual activity: Not on file  Other Topics Concern   Not on file  Social History Narrative   Not on file   Social Determinants of Health   Financial Resource Strain: Not on file  Food Insecurity: Not on file  Transportation Needs: Not on file  Physical Activity: Not on file  Stress: Not on file  Social Connections: Not on file   [name] Known Allergies [name] family history on file.   Current Outpatient Medications (Cardiovascular):    lisinopril (ZESTRIL) 40 MG tablet, Take 1 tablet (40 mg total) by mouth daily.   rosuvastatin (CRESTOR) 40 MG tablet, Take 1 tablet (40 mg total) by mouth daily.   lisinopril (ZESTRIL) 40 MG tablet, TAKE 1 TABLET BY MOUTH ONCE A DAY   rosuvastatin (CRESTOR) 40 MG tablet, TAKE 1 TABLET BY MOUTH ONCE A DAY   Current Outpatient Medications (Analgesics):    meloxicam (MOBIC) 15 MG tablet, Take 1 tablet (15 mg total) by mouth daily.   Current Outpatient Medications (Other):    Vitamin D, Ergocalciferol, (DRISDOL) 1.25 MG (50000  UNIT) CAPS capsule, Take 1 capsule (50,000 Units total) by mouth every 7 (seven) days.    Objective  Blood pressure 122/84, pulse (!) 59, height 6' (1.829 m), weight 210 lb (95.3 kg), SpO2 96 %.   General: [name] apparent distress alert and oriented x3 mood and affect normal, dressed appropriately.  HEENT: Pupils equal, extraocular movements intact  Respiratory: Patient's speak in full sentences and does not appear short of breath  Cardiovascular: [name] lower extremity edema, non tender, [name] erythema  Gait normal with good balance and coordination.  MSK:   Foot exam shows shows the patient does have some mild loss of transverse arch still noted.  Hallux rigidus noted on  the right but mild improvement in range of motion.  Patient does have a mild positive squeeze test with some tenderness between the fourth and fifth metatarsal heads.  Left foot continues to have the loss of the transverse arch with mild bunion and bunionette formation.  Limited muscular skeletal ultrasound was performed and interpreted by Hulan Saas, M  Limited ultrasound the patient's foot shows the patient had some new mild hypoechoic changes noted between the fourth and fifth metatarsal heads that could be consistent with a possible neuroma.  Patient does have very mild hypoechoic changes of the first metatarsal head but it does seem to be improved from previous exam.    Impression and Recommendations:    The above documentation has been reviewed and is accurate and complete Lyndal Pulley, DO

## 2021-12-05 ENCOUNTER — Ambulatory Visit (INDEPENDENT_AMBULATORY_CARE_PROVIDER_SITE_OTHER): Payer: 59 | Admitting: Family Medicine

## 2021-12-05 ENCOUNTER — Other Ambulatory Visit: Payer: Self-pay

## 2021-12-05 ENCOUNTER — Ambulatory Visit: Payer: Self-pay

## 2021-12-05 ENCOUNTER — Encounter: Payer: Self-pay | Admitting: Family Medicine

## 2021-12-05 ENCOUNTER — Other Ambulatory Visit (HOSPITAL_COMMUNITY): Payer: Self-pay

## 2021-12-05 VITALS — BP 122/84 | HR 59 | Ht 72.0 in | Wt 210.0 lb

## 2021-12-05 DIAGNOSIS — M79672 Pain in left foot: Secondary | ICD-10-CM

## 2021-12-05 DIAGNOSIS — M79671 Pain in right foot: Secondary | ICD-10-CM

## 2021-12-05 MED ORDER — MELOXICAM 15 MG PO TABS
15.0000 mg | ORAL_TABLET | Freq: Every day | ORAL | 0 refills | Status: DC
  Filled 2021-12-05: qty 30, 30d supply, fill #0

## 2021-12-05 NOTE — Patient Instructions (Signed)
Good to see you Refill Meloxicam use in 5 day bursts Do not lace last eye Minute jog minute walk and increase 1 min a week See me in 2-3 months

## 2021-12-05 NOTE — Assessment & Plan Note (Signed)
Patient does have a loss of the transverse arch as well as the whole extremity dysmetria likely.  Patient on ultrasound does have improvement in some of the hypoechoic changes that was noted previously.  I do think the patient is doing well with the over-the-counter orthotics, different shoes.  Patient given a walker on protocol and see how patient responds.  It does appear that patient is potentially making a new neuroma we will need to admit potentially monitor.  Worsening pain in between the fourth and fifth on the right side may need to consider the possibility of injection.  I believe that the patient will do well with conservative therapy at this time.  Follow-up again scheduled in 2 to 3 months

## 2022-02-14 ENCOUNTER — Other Ambulatory Visit (HOSPITAL_COMMUNITY): Payer: Self-pay

## 2022-03-15 ENCOUNTER — Other Ambulatory Visit: Payer: Self-pay | Admitting: *Deleted

## 2022-03-15 DIAGNOSIS — R972 Elevated prostate specific antigen [PSA]: Secondary | ICD-10-CM

## 2022-03-15 LAB — PSA: Prostate Specific Ag, Serum: 4.8 ng/mL — ABNORMAL HIGH (ref 0.0–4.0)

## 2022-05-14 ENCOUNTER — Other Ambulatory Visit (HOSPITAL_COMMUNITY): Payer: Self-pay

## 2022-06-03 DIAGNOSIS — H2513 Age-related nuclear cataract, bilateral: Secondary | ICD-10-CM | POA: Diagnosis not present

## 2022-06-03 DIAGNOSIS — H43811 Vitreous degeneration, right eye: Secondary | ICD-10-CM | POA: Diagnosis not present

## 2022-06-04 ENCOUNTER — Encounter (INDEPENDENT_AMBULATORY_CARE_PROVIDER_SITE_OTHER): Payer: 59 | Admitting: Ophthalmology

## 2022-06-04 DIAGNOSIS — H31001 Unspecified chorioretinal scars, right eye: Secondary | ICD-10-CM | POA: Diagnosis not present

## 2022-06-04 DIAGNOSIS — H43813 Vitreous degeneration, bilateral: Secondary | ICD-10-CM

## 2022-07-09 ENCOUNTER — Other Ambulatory Visit: Payer: Self-pay | Admitting: *Deleted

## 2022-07-09 DIAGNOSIS — R972 Elevated prostate specific antigen [PSA]: Secondary | ICD-10-CM

## 2022-07-11 DIAGNOSIS — R972 Elevated prostate specific antigen [PSA]: Secondary | ICD-10-CM | POA: Diagnosis not present

## 2022-07-12 DIAGNOSIS — R972 Elevated prostate specific antigen [PSA]: Secondary | ICD-10-CM | POA: Diagnosis not present

## 2022-07-12 LAB — PSA: Prostate Specific Ag, Serum: 4.6 ng/mL — ABNORMAL HIGH (ref 0.0–4.0)

## 2022-07-18 ENCOUNTER — Encounter (INDEPENDENT_AMBULATORY_CARE_PROVIDER_SITE_OTHER): Payer: 59 | Admitting: Ophthalmology

## 2022-07-18 DIAGNOSIS — H43811 Vitreous degeneration, right eye: Secondary | ICD-10-CM | POA: Diagnosis not present

## 2022-07-18 DIAGNOSIS — H31001 Unspecified chorioretinal scars, right eye: Secondary | ICD-10-CM

## 2022-08-12 ENCOUNTER — Other Ambulatory Visit (HOSPITAL_COMMUNITY): Payer: Self-pay

## 2022-09-24 IMAGING — CT CT CARDIAC CORONARY ARTERY CALCIUM SCORE
3 series · 14 of 20 positions shown, 15 images · non-contrast
Comparison: None.

Addendum:
DATA:
Cardiovascular Disease Risk stratification

EXAM:
Coronary Calcium Score
TECHNIQUE: A gated, non-contrast computed tomography scan of the heart was
performed using 3mm slice thickness. Axial images were analyzed on a
dedicated workstation. Calcium scoring of the coronary arteries was
performed using the Agatston method.

[Series 3: ax ca scr 70% (id) · axial · 0.40mm/px · z∈[+1090,+1210]mm · 6 of 84 slices shown]
[im 12/84  vessel]
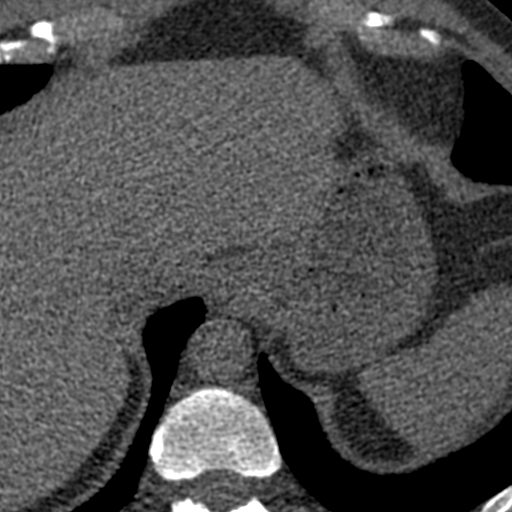
[im 24/84  vessel]
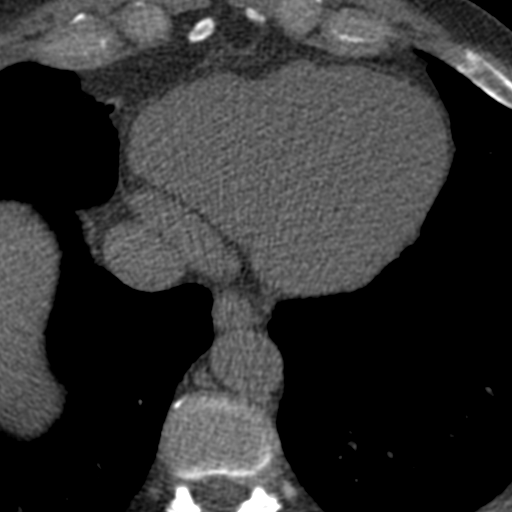
[im 36/84  vessel]
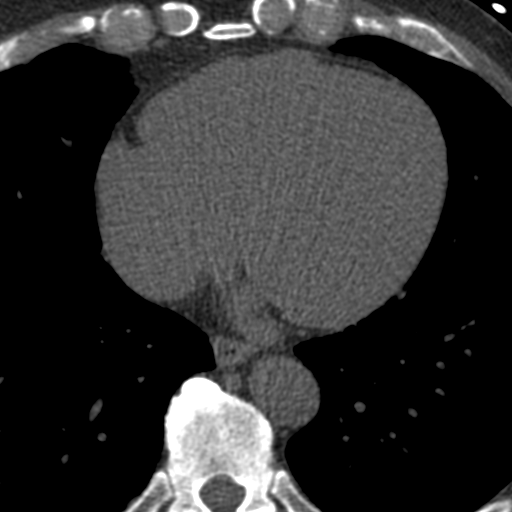
[im 48/84  vessel]
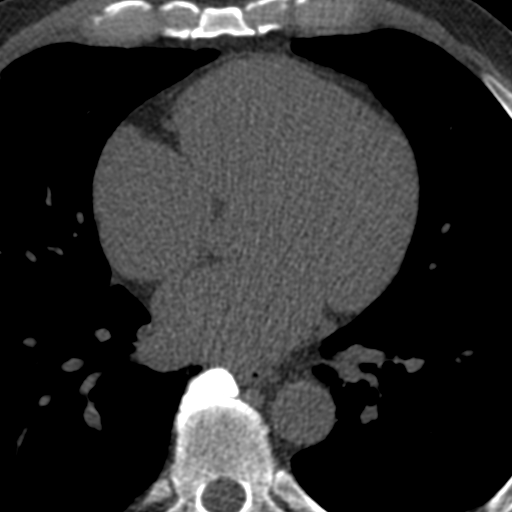
[im 60/84  vessel]
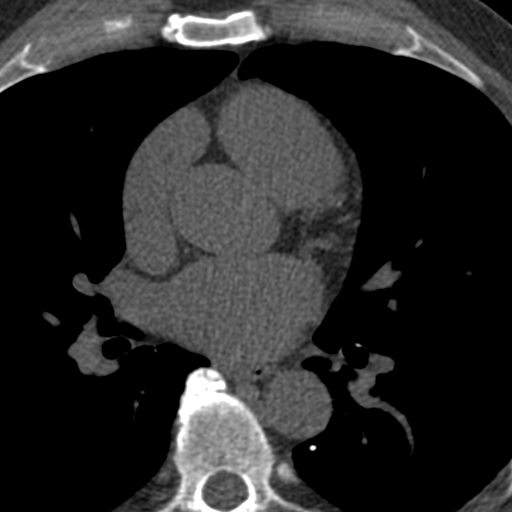
[im 72/84  vessel]
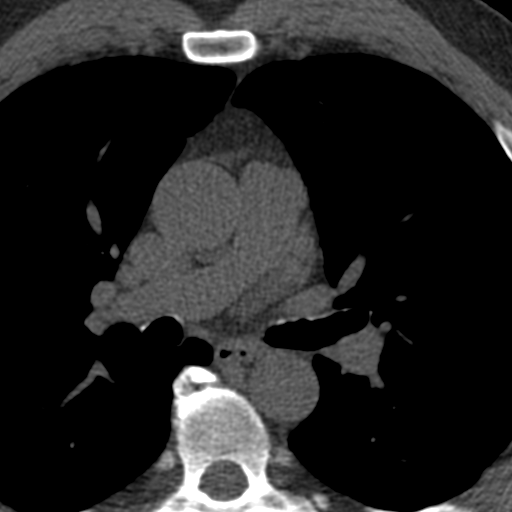

[Series 4: ax st · axial · 0.89mm/px · z∈[+1101,+1200]mm · 4 of 56 slices shown, 5 images]
[im 12/56  vessel]
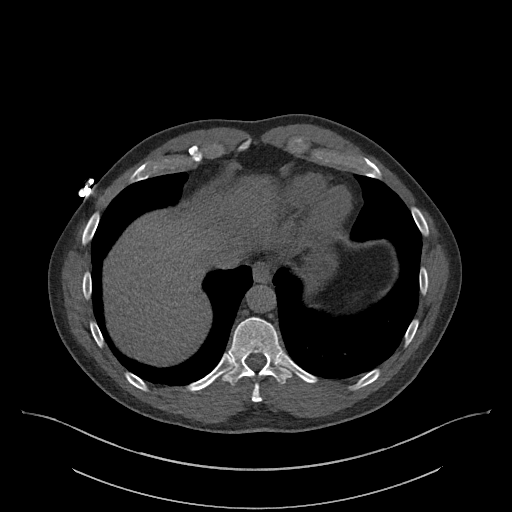
[im 12/56  lung]
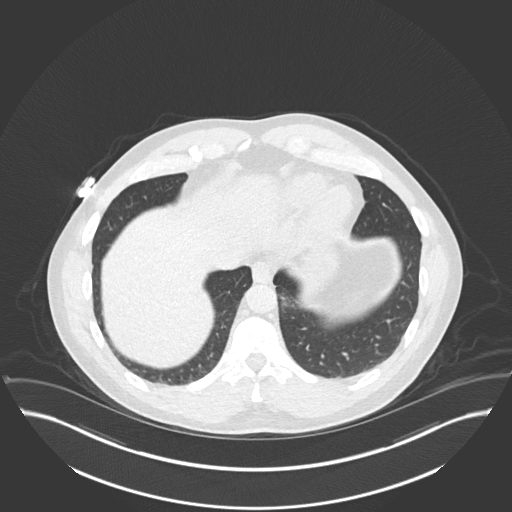
[im 23/56  vessel]
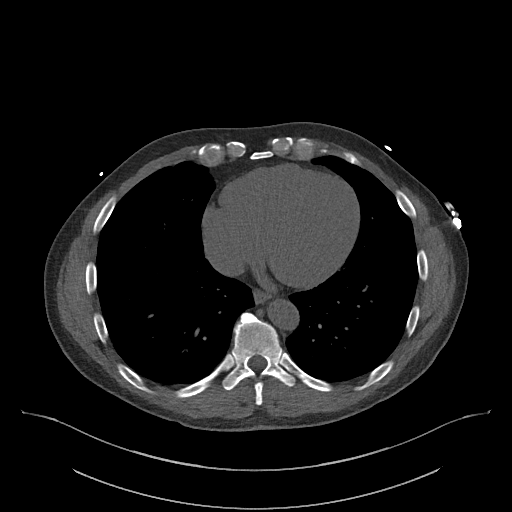
[im 34/56  vessel]
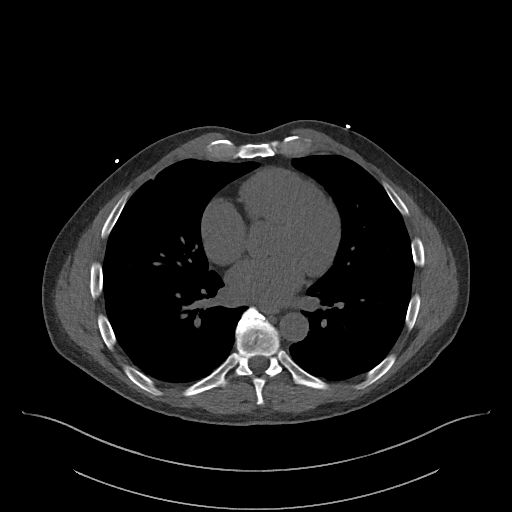
[im 45/56  vessel]
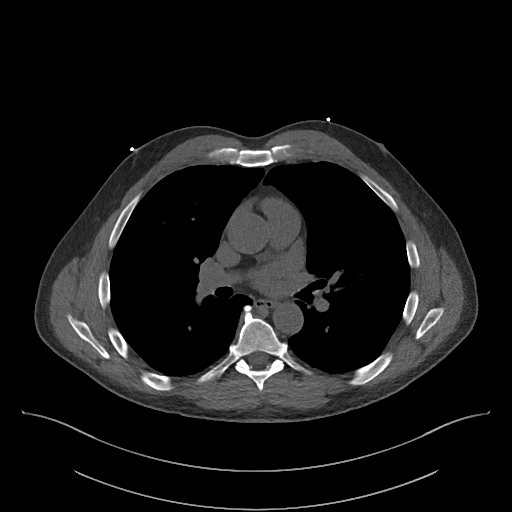

[Series 5: ax lung · axial · 0.89mm/px · z∈[+1101,+1200]mm · 4 of 56 slices shown]
[im 12/56  lung]
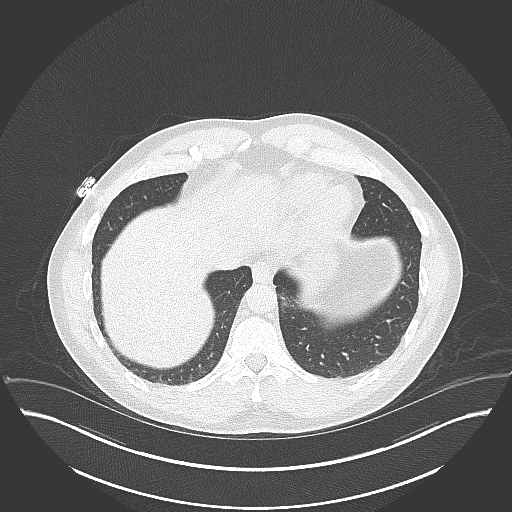
[im 23/56  lung]
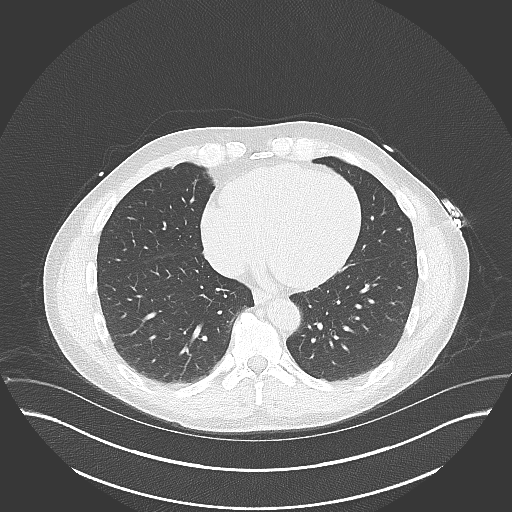
[im 34/56  lung]
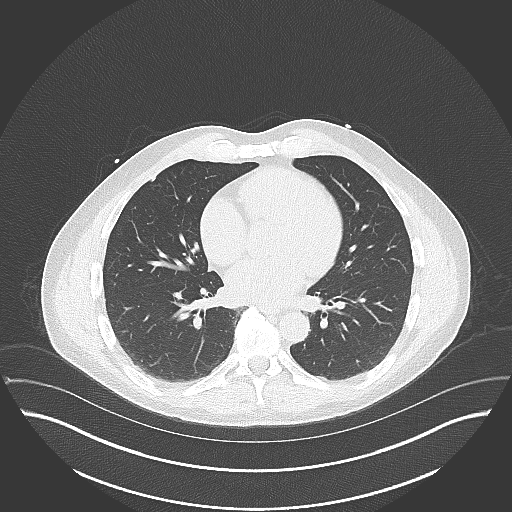
[im 45/56  lung]
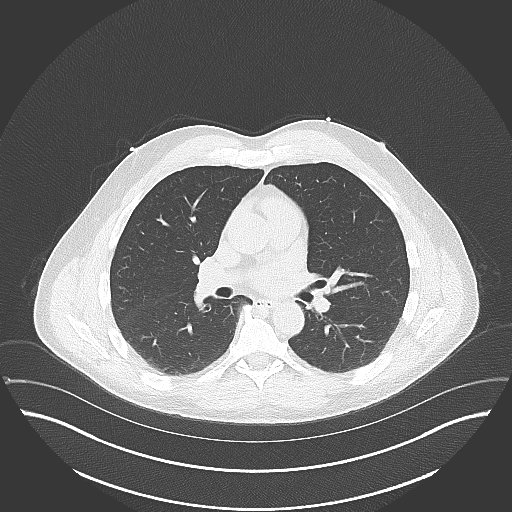

[14 of 20 positions shown; findings below may reference images not displayed]

FINDINGS: Coronary arteries: Normal origins.

Coronary Calcium Score:

Left main: 0

Left anterior descending artery:

Left circumflex artery: 0

Right coronary artery: 0

Total:

Percentile: 61 st

Pericardium: Normal.

Ascending Aorta: Normal caliber.

Non-cardiac: See separate report from [REDACTED].
IMPRESSION: Coronary calcium score of 35.8. This was 61 st percentile for age-,
race-, and sex-matched controls.



If CAC=0, it is reasonable to withhold statin therapy and reassess
in 5 to 10 years, as long as higher risk conditions are absent
(diabetes mellitus, family history of premature CHD in first degree
relatives (males <55 years; females <65 years), cigarette smoking,
or LDL >=190 mg/dL).

If CAC is 1 to 99, it is reasonable to initiate statin therapy for
patients >=55 years of age.

If CAC is >=100 or >=75th percentile, it is reasonable to initiate
statin therapy at any age.

Cardiology referral should be considered for patients with CAC
scores >=400 or >=75th percentile.

*2161 AHA/ACC/AACVPR/AAPA/ABC/BREMER/BAKIL/BRIENZA/Dionna/ZAMMAR/WILLIO/ZEINAB
Guideline on the Management of Blood Cholesterol: A Report of the
American College of Cardiology/American Heart Association Task Force
on Clinical Practice Guidelines. J Am Coll Cardiol.
4934;73(24):5566-5102.

EXAM:
OVER-READ INTERPRETATION  CT CHEST

The following report is an over-read performed by radiologist Dr.
over-read does not include interpretation of cardiac or coronary
anatomy or pathology. The coronary calcium score interpretation by
the cardiologist is attached.
FINDINGS: Within the visualized portions of the thorax there are no suspicious
appearing pulmonary nodules or masses, there is no acute
consolidative airspace disease, no pleural effusions, no
pneumothorax and no lymphadenopathy. Visualized portions of the
upper abdomen are unremarkable. There are no aggressive appearing
lytic or blastic lesions noted in the visualized portions of the
skeleton.
IMPRESSION: No significant incidental noncardiac findings are noted.

*** End of Addendum ***
DATA:
Cardiovascular Disease Risk stratification

EXAM:
Coronary Calcium Score
FINDINGS: Coronary arteries: Normal origins.

Coronary Calcium Score:

Left main: 0

Left anterior descending artery:

Left circumflex artery: 0

Right coronary artery: 0

Total:

Percentile: 61 st

Pericardium: Normal.

Ascending Aorta: Normal caliber.

Non-cardiac: See separate report from [REDACTED].
IMPRESSION: Coronary calcium score of 35.8. This was 61 st percentile for age-,
race-, and sex-matched controls.



If CAC=0, it is reasonable to withhold statin therapy and reassess
in 5 to 10 years, as long as higher risk conditions are absent
(diabetes mellitus, family history of premature CHD in first degree
relatives (males <55 years; females <65 years), cigarette smoking,
or LDL >=190 mg/dL).

If CAC is 1 to 99, it is reasonable to initiate statin therapy for
patients >=55 years of age.

If CAC is >=100 or >=75th percentile, it is reasonable to initiate
statin therapy at any age.

Cardiology referral should be considered for patients with CAC
scores >=400 or >=75th percentile.

*2161 AHA/ACC/AACVPR/AAPA/ABC/BREMER/BAKIL/BRIENZA/Dionna/ZAMMAR/WILLIO/ZEINAB
Guideline on the Management of Blood Cholesterol: A Report of the
American College of Cardiology/American Heart Association Task Force
on Clinical Practice Guidelines. J Am Coll Cardiol.
4934;73(24):5566-5102.

## 2022-10-03 ENCOUNTER — Other Ambulatory Visit: Payer: Self-pay | Admitting: *Deleted

## 2022-10-03 DIAGNOSIS — R972 Elevated prostate specific antigen [PSA]: Secondary | ICD-10-CM

## 2022-10-04 LAB — PSA: Prostate Specific Ag, Serum: 4 ng/mL (ref 0.0–4.0)

## 2022-12-24 DIAGNOSIS — H5213 Myopia, bilateral: Secondary | ICD-10-CM | POA: Diagnosis not present

## 2023-02-04 ENCOUNTER — Other Ambulatory Visit (HOSPITAL_COMMUNITY): Payer: Self-pay

## 2023-02-04 MED ORDER — LISINOPRIL 40 MG PO TABS
40.0000 mg | ORAL_TABLET | Freq: Every day | ORAL | 4 refills | Status: AC
  Filled 2023-02-04: qty 90, 90d supply, fill #0
  Filled 2023-05-13: qty 90, 90d supply, fill #1
  Filled 2023-08-11: qty 90, 90d supply, fill #2
  Filled 2023-11-19: qty 90, 90d supply, fill #3

## 2023-02-04 MED ORDER — ROSUVASTATIN CALCIUM 40 MG PO TABS
40.0000 mg | ORAL_TABLET | Freq: Every day | ORAL | 4 refills | Status: AC
  Filled 2023-02-04: qty 90, 90d supply, fill #0
  Filled 2023-05-13: qty 90, 90d supply, fill #1
  Filled 2023-08-11: qty 90, 90d supply, fill #2
  Filled 2023-11-19: qty 90, 90d supply, fill #3

## 2023-03-14 DIAGNOSIS — K429 Umbilical hernia without obstruction or gangrene: Secondary | ICD-10-CM | POA: Diagnosis not present

## 2023-03-14 DIAGNOSIS — K4020 Bilateral inguinal hernia, without obstruction or gangrene, not specified as recurrent: Secondary | ICD-10-CM | POA: Diagnosis not present

## 2023-03-14 DIAGNOSIS — L723 Sebaceous cyst: Secondary | ICD-10-CM | POA: Diagnosis not present

## 2023-04-22 ENCOUNTER — Other Ambulatory Visit: Payer: Self-pay | Admitting: *Deleted

## 2023-04-22 DIAGNOSIS — R972 Elevated prostate specific antigen [PSA]: Secondary | ICD-10-CM | POA: Diagnosis not present

## 2023-04-23 LAB — PSA: Prostate Specific Ag, Serum: 4.8 ng/mL — ABNORMAL HIGH (ref 0.0–4.0)

## 2023-04-28 ENCOUNTER — Other Ambulatory Visit: Payer: Self-pay | Admitting: Urology

## 2023-04-28 DIAGNOSIS — R972 Elevated prostate specific antigen [PSA]: Secondary | ICD-10-CM

## 2023-05-26 ENCOUNTER — Ambulatory Visit
Admission: RE | Admit: 2023-05-26 | Discharge: 2023-05-26 | Disposition: A | Discharge status: Home / Self Care | Payer: 59 | Source: Ambulatory Visit | Attending: Urology | Admitting: Urology

## 2023-05-26 DIAGNOSIS — R972 Elevated prostate specific antigen [PSA]: Secondary | ICD-10-CM | POA: Diagnosis not present

## 2023-05-26 MED ORDER — GADOPICLENOL 0.5 MMOL/ML IV SOLN
10.0000 mL | Freq: Once | INTRAVENOUS | Status: AC | PRN
  Administered 2023-05-26: 10 mL via INTRAVENOUS

## 2023-05-27 ENCOUNTER — Other Ambulatory Visit (HOSPITAL_BASED_OUTPATIENT_CLINIC_OR_DEPARTMENT_OTHER): Payer: Self-pay

## 2023-05-27 ENCOUNTER — Other Ambulatory Visit (HOSPITAL_COMMUNITY): Payer: Self-pay

## 2023-05-27 ENCOUNTER — Other Ambulatory Visit: Payer: Self-pay | Admitting: General Surgery

## 2023-05-27 DIAGNOSIS — K429 Umbilical hernia without obstruction or gangrene: Secondary | ICD-10-CM | POA: Diagnosis not present

## 2023-05-27 DIAGNOSIS — L723 Sebaceous cyst: Secondary | ICD-10-CM | POA: Diagnosis not present

## 2023-05-27 DIAGNOSIS — K4020 Bilateral inguinal hernia, without obstruction or gangrene, not specified as recurrent: Secondary | ICD-10-CM | POA: Diagnosis not present

## 2023-05-27 MED ORDER — TRAMADOL HCL 50 MG PO TABS
50.0000 mg | ORAL_TABLET | Freq: Four times a day (QID) | ORAL | 0 refills | Status: DC
  Filled 2023-05-27: qty 20, 5d supply, fill #0

## 2023-05-27 MED ORDER — TRAMADOL HCL 50 MG PO TABS
50.0000 mg | ORAL_TABLET | Freq: Four times a day (QID) | ORAL | 0 refills | Status: DC | PRN
  Filled 2023-05-27 (×2): qty 20, 5d supply, fill #0

## 2023-06-13 ENCOUNTER — Other Ambulatory Visit (HOSPITAL_COMMUNITY): Payer: Self-pay

## 2023-06-13 MED ORDER — NEOMYCIN-POLYMYXIN-DEXAMETH 3.5-10000-0.1 OP SUSP
1.0000 [drp] | Freq: Four times a day (QID) | OPHTHALMIC | 0 refills | Status: DC
  Filled 2023-06-13: qty 5, 7d supply, fill #0

## 2023-07-31 ENCOUNTER — Telehealth: Payer: Self-pay | Admitting: Internal Medicine

## 2023-07-31 DIAGNOSIS — Z1211 Encounter for screening for malignant neoplasm of colon: Secondary | ICD-10-CM

## 2023-07-31 NOTE — Telephone Encounter (Signed)
Please contact Dr. Jens Som and schedule direct screening colonoscopy for December 2024  Encounter Diagnosis  Name Primary?   Colon cancer screening Yes

## 2023-08-01 NOTE — Telephone Encounter (Signed)
I left Dr Jens Som a detailed voice mail on his cell # to please call us back to set up his screening colon and pre-visit.

## 2023-08-08 NOTE — Telephone Encounter (Signed)
Patient scheduled for pre visit and procedure.

## 2023-08-13 ENCOUNTER — Encounter: Payer: Self-pay | Admitting: Internal Medicine

## 2023-09-08 ENCOUNTER — Other Ambulatory Visit (HOSPITAL_COMMUNITY): Payer: Self-pay

## 2023-09-08 ENCOUNTER — Ambulatory Visit (AMBULATORY_SURGERY_CENTER): Payer: 59

## 2023-09-08 ENCOUNTER — Other Ambulatory Visit: Payer: Self-pay

## 2023-09-08 VITALS — Ht 72.0 in | Wt 185.0 lb

## 2023-09-08 DIAGNOSIS — Z1211 Encounter for screening for malignant neoplasm of colon: Secondary | ICD-10-CM

## 2023-09-08 MED ORDER — NA SULFATE-K SULFATE-MG SULF 17.5-3.13-1.6 GM/177ML PO SOLN
1.0000 | Freq: Once | ORAL | 0 refills | Status: AC
  Filled 2023-09-08: qty 354, 1d supply, fill #0

## 2023-09-08 NOTE — Progress Notes (Signed)
Denies allergies to eggs or soy products. Denies complication of anesthesia or sedation. Denies use of weight loss medication. Denies use of O2.   Emmi instructions given for colonoscopy.  

## 2023-09-09 ENCOUNTER — Other Ambulatory Visit (HOSPITAL_COMMUNITY): Payer: Self-pay

## 2023-09-09 MED ORDER — MELOXICAM 15 MG PO TABS
15.0000 mg | ORAL_TABLET | Freq: Every day | ORAL | 6 refills | Status: AC | PRN
  Filled 2023-09-09: qty 30, 30d supply, fill #0
  Filled 2024-02-23: qty 30, 30d supply, fill #1
  Filled 2024-06-18: qty 30, 30d supply, fill #2

## 2023-09-29 ENCOUNTER — Other Ambulatory Visit: Payer: Self-pay | Admitting: *Deleted

## 2023-09-29 DIAGNOSIS — R972 Elevated prostate specific antigen [PSA]: Secondary | ICD-10-CM

## 2023-09-29 DIAGNOSIS — E785 Hyperlipidemia, unspecified: Secondary | ICD-10-CM

## 2023-09-30 LAB — LIPOPROTEIN A (LPA): Lipoprotein (a): 26.1 nmol/L (ref <75.0)

## 2023-09-30 LAB — PSA: Prostate Specific Ag, Serum: 4.9 ng/mL — ABNORMAL HIGH (ref 0.0–4.0)

## 2023-10-07 ENCOUNTER — Encounter: Payer: Self-pay | Admitting: Internal Medicine

## 2023-10-15 ENCOUNTER — Encounter: Payer: Self-pay | Admitting: Certified Registered Nurse Anesthetist

## 2023-10-15 NOTE — Progress Notes (Unsigned)
[facility] History and Physical   Primary Care Physician:  Cleatis Polka., MD   Reason for Procedure:   CRCA screening  Plan:    colonoscopy     HPI: William [name] is a 62 y.o. male here for repeat screening exam. 2014 notable for diverticulosis.   Past Medical History:  Diagnosis Date   Basal cell cancer    side nose   Hyperlipidemia    Hypertension     Past Surgical History:  Procedure Laterality Date   COLONOSCOPY  2014   HERNIA REPAIR Bilateral    KNEE SURGERY     ACL repair on right   MENISCUS REPAIR     left knee   SUPRAVENTRICULAR TACHYCARDIA ABLATION     TONSILLECTOMY      Prior to Admission medications   Medication Sig Start Date End Date Taking? Authorizing Provider  lisinopril (ZESTRIL) 40 MG tablet TAKE 1 TABLET BY MOUTH ONCE A DAY 11/28/20 11/28/21  Cleatis Polka., MD  lisinopril (ZESTRIL) 40 MG tablet Take 1 tablet (40 mg total) by mouth daily. 02/04/23     meloxicam (MOBIC) 15 MG tablet Take 1 tablet (15 mg total) by mouth daily. 12/05/21   Judi Saa, DO  meloxicam (MOBIC) 15 MG tablet Take 1 tablet (15 mg total) by mouth daily as needed for foot pain. 09/09/23     rosuvastatin (CRESTOR) 40 MG tablet TAKE 1 TABLET BY MOUTH ONCE A DAY 11/28/20 11/28/21  Cleatis Polka., MD  rosuvastatin (CRESTOR) 40 MG tablet Take 1 tablet (40 mg total) by mouth daily. 02/04/23     Vitamin D, Ergocalciferol, (DRISDOL) 1.25 MG (50000 UNIT) CAPS capsule Take 1 capsule (50,000 Units total) by mouth every 7 (seven) days. Patient not taking: Reported on 09/08/2023 11/02/21   Judi Saa, DO    Current Outpatient Medications  Medication Sig Dispense Refill   lisinopril (ZESTRIL) 40 MG tablet Take 1 tablet (40 mg total) by mouth daily. 90 tablet 4   meloxicam (MOBIC) 15 MG tablet Take 1 tablet (15 mg total) by mouth daily as needed for foot pain. 30 tablet 6   rosuvastatin (CRESTOR) 40 MG tablet Take 1 tablet (40 mg total) by mouth daily. 90 tablet  4   lisinopril (ZESTRIL) 40 MG tablet TAKE 1 TABLET BY MOUTH ONCE A DAY 90 tablet 3   rosuvastatin (CRESTOR) 40 MG tablet TAKE 1 TABLET BY MOUTH ONCE A DAY 90 tablet 3   Current Facility-Administered Medications  Medication Dose Route Frequency Provider Last Rate Last Admin   0.9 %  sodium chloride infusion  500 mL Intravenous Once Iva Boop, MD        Allergies as of 10/16/2023   (No Known Allergies)    Family History  Problem Relation Age of Onset   Colon cancer Neg Hx    Esophageal cancer Neg Hx    Rectal cancer Neg Hx    Stomach cancer Neg Hx     Social History   Socioeconomic History   Marital status: Married    Spouse name: Not on file   Number of children: Not on file   Years of education: Not on file   Highest education level: Not on file  Occupational History   Not on file  Tobacco Use   Smoking status: Never   Smokeless tobacco: Never  Substance and Sexual Activity   Alcohol use: Yes    Alcohol/week: 7.0 - 9.0 standard drinks of  alcohol    Types: 7 - 9 drink(s) per week    Comment: 1 glass a night   Drug use: No   Sexual activity: Not on file  Other Topics Concern   Not on file  Social History Narrative   Not on file   Social Drivers of Health   Financial Resource Strain: Not on file  Food Insecurity: Not on file  Transportation Needs: Not on file  Physical Activity: Not on file  Stress: Not on file  Social Connections: Not on file  Intimate Partner Violence: Not on file    Review of Systems:  All other review of systems negative except as mentioned in the HPI.  Physical Exam: Vital signs BP 127/70   Pulse 69   Temp 98.1 F (36.7 C)   Ht 6' (1.829 m)   Wt 185 lb (83.9 kg)   SpO2 98%   BMI 25.09 kg/m   General:   Alert,  Well-developed, well-nourished, pleasant and cooperative in NAD Lungs:  Clear throughout to auscultation.   Heart:  Regular rate and rhythm; no murmurs, clicks, rubs,  or gallops. Abdomen:  Soft, nontender and  nondistended. Normal bowel sounds.   Neuro/Psych:  Alert and cooperative. Normal mood and affect. A and O x 3   @Niaya Hickok  Sena Slate, MD, [facility] [phone] (pager) 10/16/2023 8:01 AM@

## 2023-10-16 ENCOUNTER — Encounter: Payer: Self-pay | Admitting: Internal Medicine

## 2023-10-16 ENCOUNTER — Ambulatory Visit: Payer: 59 | Admitting: Internal Medicine

## 2023-10-16 VITALS — BP 129/83 | HR 68 | Temp 98.1°F | Resp 12 | Ht 72.0 in | Wt 185.0 lb

## 2023-10-16 DIAGNOSIS — K5730 Diverticulosis of large intestine without perforation or abscess without bleeding: Secondary | ICD-10-CM

## 2023-10-16 DIAGNOSIS — N400 Enlarged prostate without lower urinary tract symptoms: Secondary | ICD-10-CM

## 2023-10-16 DIAGNOSIS — D123 Benign neoplasm of transverse colon: Secondary | ICD-10-CM | POA: Diagnosis not present

## 2023-10-16 DIAGNOSIS — E785 Hyperlipidemia, unspecified: Secondary | ICD-10-CM | POA: Diagnosis not present

## 2023-10-16 DIAGNOSIS — Z1211 Encounter for screening for malignant neoplasm of colon: Secondary | ICD-10-CM

## 2023-10-16 DIAGNOSIS — I10 Essential (primary) hypertension: Secondary | ICD-10-CM | POA: Diagnosis not present

## 2023-10-16 MED ORDER — SODIUM CHLORIDE 0.9 % IV SOLN: 500.0000 mL | Freq: Once | INTRAVENOUS | Status: DC

## 2023-10-16 NOTE — Progress Notes (Signed)
Called to room to assist during endoscopic procedure.  Patient ID and intended procedure confirmed with present staff. Received instructions for my participation in the procedure from the performing physician.  

## 2023-10-16 NOTE — Op Note (Signed)
[facility] Patient Name: William [name] Procedure Date: 10/16/2023 7:44 AM MRN: [id number] Endoscopist: Iva Boop , MD, [id number] Age: 62 Referring MD:  Date of Birth: Oct 06, 1961 Gender: Male Account #: [id number] Procedure:                Colonoscopy Indications:              Screening for colorectal malignant neoplasm, Last                            colonoscopy: 2014 Medicines:                Monitored Anesthesia Care Procedure:                Pre-Anesthesia Assessment:                           - Prior to the procedure, a History and Physical                            was performed, and patient medications and                            allergies were reviewed. The patient's tolerance of                            previous anesthesia was also reviewed. The risks                            and benefits of the procedure and the sedation                            options and risks were discussed with the patient.                            All questions were answered, and informed consent                            was obtained. Prior Anticoagulants: The patient has                            taken no anticoagulant or antiplatelet agents. ASA                            Grade Assessment: II - A patient with mild systemic                            disease. After reviewing the risks and benefits,                            the patient was deemed in satisfactory condition to                            undergo the procedure.  After obtaining informed consent, the colonoscope                            was passed under direct vision. Throughout the                            procedure, the patient's blood pressure, pulse, and                            oxygen saturations were monitored continuously. The                            CF [id number] #[id number] was introduced through the anus                            and advanced to the the cecum,  identified by                            appendiceal orifice and ileocecal valve. The                            colonoscopy was performed without difficulty. The                            patient tolerated the procedure well. The quality                            of the bowel preparation was good. The ileocecal                            valve, appendiceal orifice, and rectum were                            photographed. The bowel preparation used was SUPREP                            via split dose instruction. Scope In: 8:08:43 AM Scope Out: 8:26:07 AM Scope Withdrawal Time: 0 hours 14 minutes 18 seconds  Total Procedure Duration: 0 hours 17 minutes 24 seconds  Findings:                 The digital rectal exam findings include enlarged                            prostate. Pertinent negatives include no palpable                            rectal lesions.                           A 2 mm polyp was found in the splenic flexure. The                            polyp was semi-pedunculated. The polyp was removed  with a cold biopsy forceps. Resection and retrieval                            were complete. Verification of patient                            identification for the specimen was done. Estimated                            blood loss was minimal.                           Multiple diverticula were found in the sigmoid                            colon.                           The exam was otherwise without abnormality on                            direct and retroflexion views. Complications:            No immediate complications. Estimated Blood Loss:     Estimated blood loss was minimal. Impression:               - Enlarged prostate found on digital rectal exam.                           - One 2 mm polyp at the splenic flexure, removed                            with a cold biopsy forceps. Resected and retrieved.                           - Moderate  diverticulosis in the sigmoid colon.                           - The examination was otherwise normal on direct                            and retroflexion views. Recommendation:           - Patient has a contact number available for                            emergencies. The signs and symptoms of potential                            delayed complications were discussed with the                            patient. Return to normal activities tomorrow.                            Written discharge instructions were provided  to the                            patient.                           - Resume previous diet.                           - Continue present medications.                           - Repeat colonoscopy is recommended. The                            colonoscopy date will be determined after pathology                            results from today's exam become available for                            review. Iva Boop, MD 10/16/2023 8:34:15 AM This report has been signed electronically.

## 2023-10-16 NOTE — Progress Notes (Signed)
1610 Ephedrine 10 mg given IV due to low BP, MD updated.

## 2023-10-16 NOTE — Patient Instructions (Addendum)
I found a 2 mm polyp and removed it. Saw diverticulosis again. All else is normal. Prostate is enlarged as you know.   I will let you know pathology results and when to have another routine colonoscopy by mail and/or My Chart.  I appreciate the opportunity to care for you. Iva Boop, MD, FACG   YOU HAD AN ENDOSCOPIC PROCEDURE TODAY AT THE [facility]:   Refer to the procedure report that was given to you for any specific questions about what was found during the examination.  If the procedure report does not answer your questions, please call your gastroenterologist to clarify.  If you requested that your care partner not be given the details of your procedure findings, then the procedure report has been included in a sealed envelope for you to review at your convenience later.  YOU SHOULD EXPECT: Some feelings of bloating in the abdomen. Passage of more gas than usual.  Walking can help get rid of the air that was put into your GI tract during the procedure and reduce the bloating. If you had a lower endoscopy (such as a colonoscopy or flexible sigmoidoscopy) you may notice spotting of blood in your stool or on the toilet paper. If you underwent a bowel prep for your procedure, you may not have a normal bowel movement for a few days.  Please Note:  You might notice some irritation and congestion in your nose or some drainage.  This is from the oxygen used during your procedure.  There is no need for concern and it should clear up in a day or so.  SYMPTOMS TO REPORT IMMEDIATELY:  Following lower endoscopy (colonoscopy or flexible sigmoidoscopy):  Excessive amounts of blood in the stool  Significant tenderness or worsening of abdominal pains  Swelling of the abdomen that is new, acute  Fever of 100F or higher  For urgent or emergent issues, a gastroenterologist can be reached at any hour by calling [phone]. Do not use MyChart messaging for urgent concerns.     DIET:  We do recommend a small meal at first, but then you may proceed to your regular diet.  Drink plenty of fluids but you should avoid alcoholic beverages for 24 hours.  ACTIVITY:  You should plan to take it easy for the rest of today and you should NOT DRIVE or use heavy machinery until tomorrow (because of the sedation medicines used during the test).    FOLLOW UP: Our staff will call the number listed on your records the next business day following your procedure.  We will call around 7:15- 8:00 am to check on you and address any questions or concerns that you may have regarding the information given to you following your procedure. If we do not reach you, we will leave a message.     If any biopsies were taken you will be contacted by phone or by letter within the next 1-3 weeks.  Please call us at [phone] if you have not heard about the biopsies in 3 weeks.    SIGNATURES/CONFIDENTIALITY: You and/or your care partner have signed paperwork which will be entered into your electronic medical record.  These signatures attest to the fact that that the information above on your After Visit Summary has been reviewed and is understood.  Full responsibility of the confidentiality of this discharge information lies with you and/or your care-partner.

## 2023-10-16 NOTE — Progress Notes (Signed)
Report given to PACU, vss 

## 2023-10-17 ENCOUNTER — Telehealth: Payer: Self-pay

## 2023-10-17 NOTE — Telephone Encounter (Signed)
  Follow up Call-     10/16/2023    7:13 AM  Call back number  Post procedure Call Back phone  # [phone]  Permission to leave phone message Yes     Patient questions:  Do you have a fever, pain , or abdominal swelling? No. Pain Score  0 *  Have you tolerated food without any problems? Yes.    Have you been able to return to your normal activities? Yes.    Do you have any questions about your discharge instructions: Diet   No. Medications  No. Follow up visit  No.  Do you have questions or concerns about your Care? No.  Actions: * If pain score is 4 or above: No action needed, pain <4.

## 2023-10-20 LAB — SURGICAL PATHOLOGY

## 2023-10-23 ENCOUNTER — Encounter: Payer: Self-pay | Admitting: Internal Medicine

## 2023-10-23 DIAGNOSIS — Z860101 Personal history of adenomatous and serrated colon polyps: Secondary | ICD-10-CM | POA: Insufficient documentation

## 2023-10-23 HISTORY — DX: Personal history of adenomatous and serrated colon polyps: Z86.0101

## 2023-11-06 ENCOUNTER — Ambulatory Visit (INDEPENDENT_AMBULATORY_CARE_PROVIDER_SITE_OTHER): Payer: 59 | Admitting: Sports Medicine

## 2023-11-06 VITALS — BP 141/88 | Ht 72.0 in | Wt 185.0 lb

## 2023-11-06 DIAGNOSIS — M79672 Pain in left foot: Secondary | ICD-10-CM

## 2023-11-06 DIAGNOSIS — M2021 Hallux rigidus, right foot: Secondary | ICD-10-CM | POA: Diagnosis not present

## 2023-11-06 DIAGNOSIS — M7742 Metatarsalgia, left foot: Secondary | ICD-10-CM

## 2023-11-06 DIAGNOSIS — M79671 Pain in right foot: Secondary | ICD-10-CM

## 2023-11-06 DIAGNOSIS — M2170 Unequal limb length (acquired), unspecified site: Secondary | ICD-10-CM

## 2023-11-06 NOTE — Progress Notes (Signed)
 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 He has been dealing with pain of the right great toe and left forefoot on the plantar surface for years. He has a history of hallux rigidus on the right and metatarsalgia on the left. Left plantar forefoot pain is not too aggravated at the time of this visit. His pain is exacerbated with activity and high impact exercise. He has had to reduce his exercise secondary to this pain and would love to get back to more regular running. He's managed pain with OTC orthotics, metatarsal cookies, and prn Mobic  with some success, however is interested to know additional options available to him. Voltaren has not been helpful for him on the great toe. Also notes occasional mid thoracic back tingling and is curious if this is related.   ROS:     See HPI  PERTINENT  PMH / PSH / FH / SH:  Past Medical, Surgical, Social, and Family History Reviewed & Updated in the EMR.  Pertinent findings include:  Hx of ACL repair of right knee Hx of meniscus repair of left knee  No Known Allergies  OBJECTIVE:  BP (!) 141/88   Ht 6' (1.829 m)   Wt 185 lb (83.9 kg)   BMI 25.09 kg/m   PHYSICAL EXAM:  GEN: Alert and Oriented, NAD, comfortable in exam room RESP: Unlabored respirations, symmetric chest rise PSY: normal mood, congruent affect  Right Foot exam:  Mild pronation at rest.  Moderate navicular drop though longitudinal arch fairly preserved.  Transverse arch significantly collapsed with  drop of 4th MT head palpated at plantar plate.  Rear foot neutral. First ray insufficiency. Great toe with moderate hallux valgus deformity. Not significantly TTP at MCP joint, though appreciable bony spurring noted. ROM significantly limited to 10d of flexion and extension. Toe knuckle pad on 2nd toe, no significant calluses on plantar aspect of forefoot.  Left Foot  exam: Rear foot neutral Mild navicular drop with fairly preserve longitudinal arch. Transverse arch collapse, though more mild than on the right. First ray insufficiency with mild splaying of 1st and 2nd toes. No significant callus on plantar aspect.  Metatarsal heads not significantly TTP. Negative Squeeze test.  Leg lengths: Leg length discrepancy of 1.5cm left longer than right from ASIS to med mal.  Hip abductors with excellent strength.  Gait: Mild trendelenburg gait. Resolved after correction of leg length discrepancy with heel lift. Otherwise neutral running gait with midfoot strike Assessment & Plan Hallux rigidus of right foot Moderate hallux rigidus, OA, and Hallux Valgus at right 1st MTP. We provided 1st ray post to his OTC orthotics today. If he finds these helpful over next 2 weeks, discussed consideration of custom orthotics at f/u. Recommend continued use of NSAIDS prn and activity modification with goal to get back to running. Can consider US  guided cortisone injection if mechanical correction is insufficient. 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 We provided MT cookies bilaterally today. Recommended indefinite continuation of these.  Leg length discrepancy Likely aquired discrepancy with 1.5cm difference, L>R. Discussed this may be a cause of his intermittent back discomfort. Medial wedge heel lift provided for right OTC orthotic today with improvement in mild trendelenburg gait. If returns for custom orthotics would continue this correction.  Prentice Niece, MD PGY-4, Sports Medicine Fellow  [facility]  I observed and examined the patient with the [facility] resident and agree with assessment and plan.  Note reviewed and modified by me. KB Fields. MD

## 2023-11-06 NOTE — Assessment & Plan Note (Signed)
 Moderate hallux rigidus, OA, and Hallux Valgus at right 1st MTP. We provided 1st ray post to his OTC orthotics today. If he finds these helpful over next 2 weeks, discussed consideration of custom orthotics at f/u. Recommend continued use of NSAIDS prn and activity modification with goal to get back to running. Can consider US  guided cortisone injection if mechanical correction is insufficient.

## 2023-12-23 ENCOUNTER — Ambulatory Visit (INDEPENDENT_AMBULATORY_CARE_PROVIDER_SITE_OTHER): Payer: 59 | Admitting: Sports Medicine

## 2023-12-23 VITALS — BP 126/82 | Ht 72.0 in | Wt 185.0 lb

## 2023-12-23 DIAGNOSIS — M79672 Pain in left foot: Secondary | ICD-10-CM | POA: Diagnosis not present

## 2023-12-23 DIAGNOSIS — M79671 Pain in right foot: Secondary | ICD-10-CM

## 2023-12-23 NOTE — Assessment & Plan Note (Signed)
 Patient was fitted for a : standard, cushioned, semi-rigid orthotic. The orthotic was heated and afterward the patient stood on the orthotic blank positioned on the orthotic stand. The patient was positioned in subtalar neutral position and 10 degrees of ankle dorsiflexion in a weight bearing stance. After completion of molding, a stable base was applied to the orthotic blank. The blank was ground to a stable position for weight bearing. Size: 11 blue med. EVA Base: RT side used blue EVA med. Density/ left poron heel pad Posting: first ray on RT Additional orthotic padding: med. MT pad RT and large on left  AT completion patient ran with neutral foot strike and with good comfort  Test these next 2 months and see if this is right strategy

## 2023-12-23 NOTE — Progress Notes (Signed)
 Patient returns to try custom orthotics  Last visit: Hal;lux rigidus RT MTP 1 Metatarsalgia L > R LLI with RT 1.5 cm short  Adjustments to his spenco inserts helped some but maybe too much pressure with MT cookies on left  Exam as noted before.

## 2024-02-24 ENCOUNTER — Other Ambulatory Visit (HOSPITAL_COMMUNITY): Payer: Self-pay

## 2024-02-24 MED ORDER — ROSUVASTATIN CALCIUM 40 MG PO TABS
40.0000 mg | ORAL_TABLET | Freq: Every day | ORAL | 4 refills | Status: AC
  Filled 2024-02-24: qty 90, 90d supply, fill #0
  Filled 2024-05-18: qty 90, 90d supply, fill #1
  Filled 2024-08-09: qty 90, 90d supply, fill #2
  Filled 2024-11-10: qty 90, 90d supply, fill #3
  Filled 2024-11-12: qty 90, 90d supply, fill #0

## 2024-02-24 MED ORDER — LISINOPRIL 40 MG PO TABS
40.0000 mg | ORAL_TABLET | Freq: Every day | ORAL | 4 refills | Status: AC
  Filled 2024-02-24: qty 90, 90d supply, fill #0
  Filled 2024-05-18: qty 90, 90d supply, fill #1
  Filled 2024-08-09: qty 90, 90d supply, fill #2
  Filled 2024-11-10: qty 90, 90d supply, fill #3
  Filled 2024-11-12: qty 90, 90d supply, fill #0

## 2024-03-09 ENCOUNTER — Other Ambulatory Visit (HOSPITAL_COMMUNITY): Payer: Self-pay

## 2024-03-09 MED ORDER — LATANOPROST 0.005 % OP SOLN
1.0000 [drp] | Freq: Every evening | OPHTHALMIC | 3 refills | Status: AC
  Filled 2024-03-09: qty 7.5, 75d supply, fill #0
  Filled 2024-06-18: qty 7.5, 75d supply, fill #1
  Filled 2024-09-26: qty 7.5, 75d supply, fill #2

## 2024-03-16 ENCOUNTER — Other Ambulatory Visit (HOSPITAL_COMMUNITY): Payer: Self-pay

## 2024-03-16 MED ORDER — LEVOFLOXACIN 750 MG PO TABS
750.0000 mg | ORAL_TABLET | Freq: Once | ORAL | 0 refills | Status: AC
Start: 2024-03-16 — End: 2024-03-19
  Filled 2024-03-16: qty 1, 1d supply, fill #0

## 2024-04-05 DIAGNOSIS — C61 Malignant neoplasm of prostate: Secondary | ICD-10-CM | POA: Diagnosis not present

## 2024-04-09 DIAGNOSIS — C61 Malignant neoplasm of prostate: Secondary | ICD-10-CM | POA: Diagnosis not present

## 2024-05-04 DIAGNOSIS — C61 Malignant neoplasm of prostate: Secondary | ICD-10-CM | POA: Diagnosis not present

## 2024-06-21 ENCOUNTER — Other Ambulatory Visit (HOSPITAL_COMMUNITY): Payer: Self-pay

## 2024-07-30 ENCOUNTER — Other Ambulatory Visit (HOSPITAL_COMMUNITY): Payer: Self-pay

## 2024-07-30 MED ORDER — FLUZONE 0.5 ML IM SUSY
0.5000 mL | PREFILLED_SYRINGE | Freq: Once | INTRAMUSCULAR | 0 refills | Status: AC
Start: 2024-07-30 — End: 2024-07-31
  Filled 2024-07-30: qty 0.5, 1d supply, fill #0

## 2024-09-07 DIAGNOSIS — C61 Malignant neoplasm of prostate: Secondary | ICD-10-CM | POA: Diagnosis not present

## 2024-09-14 DIAGNOSIS — C61 Malignant neoplasm of prostate: Secondary | ICD-10-CM | POA: Diagnosis not present

## 2024-09-28 DIAGNOSIS — C61 Malignant neoplasm of prostate: Secondary | ICD-10-CM | POA: Diagnosis not present

## 2024-09-30 ENCOUNTER — Other Ambulatory Visit (HOSPITAL_COMMUNITY): Payer: Self-pay | Admitting: Urology

## 2024-09-30 DIAGNOSIS — C61 Malignant neoplasm of prostate: Secondary | ICD-10-CM

## 2024-10-08 ENCOUNTER — Encounter (HOSPITAL_COMMUNITY): Admission: RE | Admit: 2024-10-08 | Discharge: 2024-10-08 | Attending: Urology | Admitting: Urology

## 2024-10-08 DIAGNOSIS — R932 Abnormal findings on diagnostic imaging of liver and biliary tract: Secondary | ICD-10-CM | POA: Diagnosis not present

## 2024-10-08 DIAGNOSIS — C61 Malignant neoplasm of prostate: Secondary | ICD-10-CM | POA: Diagnosis not present

## 2024-10-08 MED ORDER — FLOTUFOLASTAT F 18 GALLIUM 296-5846 MBQ/ML IV SOLN
8.4000 | Freq: Once | INTRAVENOUS | Status: AC
  Administered 2024-10-08: 8.4 via INTRAVENOUS

## 2024-11-09 ENCOUNTER — Other Ambulatory Visit: Payer: Self-pay | Admitting: Urology

## 2024-11-10 ENCOUNTER — Other Ambulatory Visit: Payer: Self-pay

## 2024-11-11 ENCOUNTER — Other Ambulatory Visit (HOSPITAL_COMMUNITY): Payer: Self-pay

## 2024-11-11 MED ORDER — MELOXICAM 15 MG PO TABS
15.0000 mg | ORAL_TABLET | Freq: Every day | ORAL | 6 refills | Status: AC | PRN
  Filled 2024-11-11: qty 30, 30d supply, fill #0

## 2024-11-12 ENCOUNTER — Other Ambulatory Visit (HOSPITAL_COMMUNITY): Payer: Self-pay

## 2025-01-13 ENCOUNTER — Ambulatory Visit (HOSPITAL_COMMUNITY): Admit: 2025-01-13 | Admitting: Urology
